# Patient Record
Sex: Female | Born: 1980 | Hispanic: Yes | Marital: Married | Smoking: Never smoker
Health system: Southern US, Community
[De-identification: ages and names within clinical notes are randomized; demographics above are authoritative.]

## PROBLEM LIST (undated history)

## (undated) ENCOUNTER — Inpatient Hospital Stay (HOSPITAL_COMMUNITY): Payer: Self-pay

## (undated) DIAGNOSIS — O352XX1 Maternal care for (suspected) hereditary disease in fetus, fetus 1: Secondary | ICD-10-CM

## (undated) DIAGNOSIS — E119 Type 2 diabetes mellitus without complications: Secondary | ICD-10-CM

## (undated) DIAGNOSIS — O09522 Supervision of elderly multigravida, second trimester: Secondary | ICD-10-CM

## (undated) DIAGNOSIS — R7611 Nonspecific reaction to tuberculin skin test without active tuberculosis: Secondary | ICD-10-CM

## (undated) HISTORY — DX: Supervision of elderly multigravida, second trimester: O09.522

## (undated) HISTORY — DX: Nonspecific reaction to tuberculin skin test without active tuberculosis: R76.11

## (undated) HISTORY — PX: NO PAST SURGERIES: SHX2092

## (undated) HISTORY — PX: TOOTH EXTRACTION: SUR596

## (undated) HISTORY — DX: Maternal care for (suspected) hereditary disease in fetus, fetus 1: O35.2XX1

---

## 2010-07-08 ENCOUNTER — Ambulatory Visit: Payer: Self-pay | Admitting: Gynecology

## 2010-07-08 ENCOUNTER — Other Ambulatory Visit
Admission: RE | Admit: 2010-07-08 | Discharge: 2010-07-08 | Payer: Self-pay | Source: Home / Self Care | Admitting: Gynecology

## 2011-05-11 ENCOUNTER — Other Ambulatory Visit: Payer: Self-pay | Admitting: Infectious Diseases

## 2011-05-11 ENCOUNTER — Ambulatory Visit
Admission: RE | Admit: 2011-05-11 | Discharge: 2011-05-11 | Disposition: A | Discharge status: Home / Self Care | Payer: No Typology Code available for payment source | Source: Ambulatory Visit | Attending: Infectious Diseases | Admitting: Infectious Diseases

## 2011-05-11 DIAGNOSIS — R7611 Nonspecific reaction to tuberculin skin test without active tuberculosis: Secondary | ICD-10-CM

## 2011-05-11 LAB — OB RESULTS CONSOLE HEPATITIS B SURFACE ANTIGEN: Hepatitis B Surface Ag: NEGATIVE

## 2011-05-11 LAB — OB RESULTS CONSOLE RUBELLA ANTIBODY, IGM: Rubella: IMMUNE

## 2011-05-11 LAB — OB RESULTS CONSOLE RPR: RPR: NONREACTIVE

## 2011-05-11 LAB — OB RESULTS CONSOLE ABO/RH

## 2011-06-05 ENCOUNTER — Emergency Department (HOSPITAL_COMMUNITY)
Admission: EM | Admit: 2011-06-05 | Discharge: 2011-06-06 | Disposition: A | Discharge status: Home / Self Care | Payer: Self-pay | Attending: Emergency Medicine | Admitting: Emergency Medicine

## 2011-06-05 ENCOUNTER — Encounter: Payer: Self-pay | Admitting: *Deleted

## 2011-06-05 DIAGNOSIS — R109 Unspecified abdominal pain: Secondary | ICD-10-CM | POA: Insufficient documentation

## 2011-06-05 DIAGNOSIS — O200 Threatened abortion: Secondary | ICD-10-CM | POA: Insufficient documentation

## 2011-06-05 NOTE — ED Notes (Addendum)
C/o vaginal bleeding, onset today, "[redacted] weeks pregnant", (denies: nvd fever or other sx). Does not have an OBGYN or PCP. Has paperwork from [facility] with positive pregnancy report, has referrals. Describes bleeding as medium bright red, has not needed or used any pads yet, "cleaned herself up and came here". Denies dizziness, weakness or tiredness.

## 2011-06-06 ENCOUNTER — Emergency Department (HOSPITAL_COMMUNITY): Payer: Self-pay

## 2011-06-06 LAB — TYPE AND SCREEN: ABO/RH(D): A POS

## 2011-06-06 LAB — ABO/RH: ABO/RH(D): A POS

## 2011-06-06 LAB — WET PREP, GENITAL
Trich, Wet Prep: NONE SEEN
Yeast Wet Prep HPF POC: NONE SEEN

## 2011-06-06 NOTE — ED Provider Notes (Signed)
History     CSN: [id number] Arrival date & time: 06/05/2011  8:46 PM   First MD Initiated Contact with Patient 06/06/11 0119      Chief Complaint  Patient presents with  . Vaginal Bleeding    (Consider location/radiation/quality/duration/timing/severity/associated sxs/prior treatment) HPI This is a 30 year old Hispanic female who is about [redacted] weeks pregnant. She developed vaginal bleeding yesterday which which she describes as mild. This is accompanied by mild suprapubic cramping. She denies nausea, vomiting, diarrhea or vaginal discharge. There are no exacerbating or mitigating factors  History reviewed. No pertinent past medical history.  History reviewed. No pertinent past surgical history.  Family History  Problem Relation Age of Onset  . Hypertension Father   . Diabetes Other     History  Substance Use Topics  . Smoking status: Never Smoker   . Smokeless tobacco: Not on file  . Alcohol Use: Yes    OB History    Grav Para Term Preterm Abortions TAB SAB Ect Mult Living   1               Review of Systems  All other systems reviewed and are negative.    Allergies  Review of patient's allergies indicates no known allergies.  Home Medications   Current Outpatient Rx  Name Route Sig Dispense Refill  . PRENATAL 27-0.8 MG PO TABS Oral Take 1 tablet by mouth daily.        BP 112/53  Pulse 74  Temp(Src) 98.7 F (37.1 C) (Oral)  Resp 18  SpO2 99%  LMP 04/23/2011  Physical Exam General: Well-developed, well-nourished female in no acute distress; appearance consistent with age of record HENT: normocephalic, atraumatic Eyes: pupils equal round and reactive to light; extraocular muscles intact Neck: supple Heart: regular rate and rhythm Lungs: clear to auscultation bilaterally Abdomen: soft; nontender; nondistended; no masses or hepatosplenomegaly; bowel sounds hyperactive GU: no flank tenderness; No vaginal bleeding; vaginal discharge, thick and mucoid; no  cervical motion tenderness mild left adnexal tenderness Extremities: No deformity; full range of motion; pulses normal Neurologic: Awake, alert; motor function intact in all extremities and symmetric; no facial droop Skin: Warm and dry Psychiatric: Normal mood and affect    ED Course  Procedures (including critical care time)   MDM   Nursing notes and vitals signs, including pulse oximetry, reviewed.  Summary of this visit's results, reviewed by myself:  Labs:  Results for orders placed during the hospital encounter of 06/05/11  HCG, QUANTITATIVE, PREGNANCY      Component Value Range   hCG, Beta Chain, Quant, S 14474 (*) <5 (mIU/mL)  TYPE AND SCREEN      Component Value Range   ABO/RH(D) A POS     Antibody Screen NEG     Sample Expiration 06/09/2011    ABO/RH      Component Value Range   ABO/RH(D) A POS    WET PREP, GENITAL      Component Value Range   Yeast, Wet Prep NONE SEEN  NONE SEEN    Trich, Wet Prep NONE SEEN  NONE SEEN    Clue Cells, Wet Prep NONE SEEN  NONE SEEN    WBC, Wet Prep HPF POC TOO NUMEROUS TO COUNT (*) NONE SEEN    Dg Chest 2 View  05/11/2011  *RADIOLOGY REPORT*  Clinical Data: Smoker.  CHEST - 2 VIEW  Comparison: None.  Findings: Midline trachea.  Normal heart size and mediastinal contours. No pleural effusion or pneumothorax.  Clear lungs.  IMPRESSION: Normal chest.  Original Report Authenticated By: Consuello Bossier, M.D.   US Ob Comp Less 14 Wks  06/06/2011  *RADIOLOGY REPORT*  Clinical Data: Vaginal bleeding for 1 day.  OBSTETRIC <14 WK [place] AND TRANSVAGINAL OB US  Technique:  Both transabdominal and transvaginal ultrasound examinations were performed for complete evaluation of the gestation as well as the maternal uterus, adnexal regions, and pelvic cul-de-sac.  Transvaginal technique was performed to assess early pregnancy.  Comparison:  None.  Intrauterine gestational sac:  Visualized/normal in shape. Yolk sac: Yes Embryo: Yes Cardiac Activity: Yes  Heart Rate: 90 bpm  CRL: 1.7   mm     Estimated at 5 weeks 3 days   [place] EDC: 02/03/2012  Maternal uterus/adnexae: A moderate amount of subchorionic hemorrhage is noted.  A small rounded focus of decreased echogenicity adjacent to the gestational sac is nonspecific and likely relates to subchorionic hemorrhage. The uterus is otherwise grossly unremarkable in appearance.  The ovaries are within normal limits.  The right ovary measures 2.9 x 2.21-0.1 cm, while the left ovary measures 2.0 x 1.6 x 1.3 cm. No suspicious adnexal masses are seen.  There is no evidence for ovarian torsion.  IMPRESSION:  1.  Single live intrauterine pregnancy noted, with a crown-rump length of 1.7 mm, corresponding to an estimated gestational age of [redacted] weeks 3 days.  This matches the gestational age of [redacted] weeks 2 days by LMP, reflecting an estimated date of delivery of January 28, 2012. 2.  Moderate subchorionic hemorrhage noted.  Original Report Authenticated By: Tonia Ghent, M.D.     Hanley Seamen, MD 06/06/11 [phone]

## 2011-06-08 LAB — GC/CHLAMYDIA PROBE AMP, GENITAL
Chlamydia, DNA Probe: NEGATIVE
GC Probe Amp, Genital: NEGATIVE

## 2011-07-28 NOTE — L&D Delivery Note (Signed)
Delivery Note At 6:24 PM a viable female was delivered via Vaginal, Spontaneous Delivery (Presentation: Right Occiput Anterior).  APGAR: 8, 9; weight pending .   Placenta status: Intact, Spontaneous.  Cord: 3 vessels with the following complications: None.    Anesthesia: Local Episiotomy: None Lacerations: 2nd degree;Labial Suture Repair: 3.0 vicryl Est. Blood Loss (mL): 800.  Patient given cytotec with resolution of PPH.  Mom to postpartum.  Baby to nursery-stable.  Shellia Hartl JEHIEL 01/23/2012, 7:07 PM

## 2011-09-08 ENCOUNTER — Other Ambulatory Visit (HOSPITAL_COMMUNITY): Payer: Self-pay | Admitting: Physician Assistant

## 2011-09-08 DIAGNOSIS — Z3689 Encounter for other specified antenatal screening: Secondary | ICD-10-CM

## 2011-09-14 ENCOUNTER — Ambulatory Visit (HOSPITAL_COMMUNITY)
Admission: RE | Admit: 2011-09-14 | Discharge: 2011-09-14 | Disposition: A | Discharge status: Home / Self Care | Payer: Self-pay | Source: Ambulatory Visit | Attending: Physician Assistant | Admitting: Physician Assistant

## 2011-09-14 DIAGNOSIS — O358XX0 Maternal care for other (suspected) fetal abnormality and damage, not applicable or unspecified: Secondary | ICD-10-CM | POA: Insufficient documentation

## 2011-09-14 DIAGNOSIS — Z3689 Encounter for other specified antenatal screening: Secondary | ICD-10-CM

## 2011-09-14 DIAGNOSIS — Z363 Encounter for antenatal screening for malformations: Secondary | ICD-10-CM | POA: Insufficient documentation

## 2011-09-14 DIAGNOSIS — Z1389 Encounter for screening for other disorder: Secondary | ICD-10-CM | POA: Insufficient documentation

## 2011-11-27 ENCOUNTER — Inpatient Hospital Stay (HOSPITAL_COMMUNITY): Payer: Self-pay

## 2011-11-27 ENCOUNTER — Inpatient Hospital Stay (HOSPITAL_COMMUNITY)
Admission: AD | Admit: 2011-11-27 | Discharge: 2011-11-27 | Disposition: A | Discharge status: Home / Self Care | Payer: Self-pay | Source: Ambulatory Visit | Attending: Obstetrics & Gynecology | Admitting: Obstetrics & Gynecology

## 2011-11-27 ENCOUNTER — Encounter (HOSPITAL_COMMUNITY): Payer: Self-pay | Admitting: *Deleted

## 2011-11-27 DIAGNOSIS — O479 False labor, unspecified: Secondary | ICD-10-CM

## 2011-11-27 DIAGNOSIS — N939 Abnormal uterine and vaginal bleeding, unspecified: Secondary | ICD-10-CM

## 2011-11-27 DIAGNOSIS — O2390 Unspecified genitourinary tract infection in pregnancy, unspecified trimester: Secondary | ICD-10-CM | POA: Insufficient documentation

## 2011-11-27 DIAGNOSIS — B373 Candidiasis of vulva and vagina: Secondary | ICD-10-CM

## 2011-11-27 DIAGNOSIS — O4690 Antepartum hemorrhage, unspecified, unspecified trimester: Secondary | ICD-10-CM | POA: Insufficient documentation

## 2011-11-27 DIAGNOSIS — B3731 Acute candidiasis of vulva and vagina: Secondary | ICD-10-CM | POA: Insufficient documentation

## 2011-11-27 LAB — WET PREP, GENITAL
Clue Cells Wet Prep HPF POC: NONE SEEN
Trich, Wet Prep: NONE SEEN

## 2011-11-27 MED ORDER — FLUCONAZOLE 150 MG PO TABS: 150.0000 mg | ORAL_TABLET | Freq: Once | ORAL | Status: AC

## 2011-11-27 MED ORDER — FLUCONAZOLE 150 MG PO TABS
150.0000 mg | ORAL_TABLET | ORAL | Status: AC
  Administered 2011-11-27: 150 mg via ORAL
  Filled 2011-11-27: qty 1

## 2011-11-27 NOTE — MAU Provider Note (Signed)
Chief Complaint:  Vaginal Bleeding   First Provider Initiated Contact with Patient 11/27/11 1110      HPI  Robin Alvarado is  31 y.o. G1P0 at [redacted]w[redacted]d presents with vaginal spotting, described as pinkish and noticed when wiping and in the toilet after urinating.  This spotting started yesterday and continues today.  She has had some recent vaginal discharge which she discussed with [facility], and reports good fetal movement, denies cramping/contractions, LOF, vaginal bleeding, vaginal itching/burning, urinary symptoms, h/a, dizziness, n/v, or fever/chills.  .   Pregnancy Course: uncomplicated  Past Medical History: Past Medical History  Diagnosis Date  . No pertinent past medical history     Past Surgical History: Past Surgical History  Procedure Date  . No past surgeries     Family History: Family History  Problem Relation Age of Onset  . Hypertension Father   . Diabetes Other   . Anesthesia problems Neg Hx     Social History: History  Substance Use Topics  . Smoking status: Never Smoker   . Smokeless tobacco: Never Used  . Alcohol Use: No    Allergies: No Known Allergies  Meds:  Prescriptions prior to admission  Medication Sig Dispense Refill  . Prenatal Vit-Fe Fumarate-FA (MULTIVITAMIN-PRENATAL) 27-0.8 MG TABS Take 1 tablet by mouth daily.            Physical Exam  Blood pressure 128/75, pulse 91, temperature 97.2 F (36.2 C), temperature source Oral, resp. rate 20, height 5\' 3"  (1.6 m), weight 78.563 kg (173 lb 3.2 oz), last menstrual period 04/23/2011. GENERAL: Well-developed, well-nourished female in no acute distress.  HEENT: normocephalic, good dentition HEART: normal rate RESP: normal effort ABDOMEN: Soft, nontender, nondistended, gravid.  EXTREMITIES: Nontender, no edema NEURO: alert and oriented  SPECULUM EXAM: Cervix with red patches, visually closed, copious amount of yellow fluid in vagina with  thick white/yellow discharge coating vaginal walls, which are erythemetous.  External genitalia normal.  Cervix is closed at internal os, but 1.5 cm externally, soft, and slightly effaced.  Ferning negative    Dilation: Closed Effacement (%): 30 Station: -3 Exam by:: L. Leftwhich-Kirby  FHT:  Baseline 140 , moderate variability, accelerations present, no decelerations Contractions: Occasional on toco, lasting 50-60 sec   Labs: Results for orders placed during the hospital encounter of 11/27/11 (from the past 24 hour(s))  WET PREP, GENITAL     Status: Abnormal   Collection Time   11/27/11 11:30 AM      Component Value Range   Yeast Wet Prep HPF POC MODERATE (*) NONE SEEN    Trich, Wet Prep NONE SEEN  NONE SEEN    Clue Cells Wet Prep HPF POC NONE SEEN  NONE SEEN    WBC, Wet Prep HPF POC MANY (*) NONE SEEN    Imaging:    Assessment: Vaginal candida Normal cervical length and AFI  Plan: Discussed pt with Dr Macon Large U/S for AFI and cervical length Diflucan 150 mg PO x1 dose in MAU Prescription for Diflucan 150 mg x1 dose for 3 days from now D/C home with PTL and bleeding precautions F/U with prenatal provider Return to MAU as needed  LEFTWICH-KIRBY, Akaila Rambo 5/3/201311:26 AM

## 2011-11-27 NOTE — MAU Provider Note (Signed)
Attestation of Attending Supervision of Advanced Practitioner: Evaluation and management procedures were performed by the OB Fellow/PA/CNM/NP under my supervision and collaboration. Chart reviewed, and agree with management and plan.  Abrahm Mancia, M.D. 11/27/2011 7:40 PM   

## 2011-11-27 NOTE — Discharge Instructions (Signed)
Infeccin por cndida en adultos (Candida Infection, Adult) Una infeccin por Cndida (tambin llamado infeccin por hongos levaduriformes, o infeccin por Monilia) es un crecimiento excesivo de hongos que puede ocurrir en cualquier parte del cuerpo. Una infeccin por cndida comnmente ocurre en las zonas ms calientes y hmedas del cuerpo. Usualmente, la infeccin permanece localizada pero puede diseminarse y convertirse en una infeccin sistmica. Una infeccin por cndida puede ser signo de un trastorno ms grave como diabetes, leucemia, o SIDA. Una infeccin por cndida puede ocurrir tanto en hombres como en mujeres. En mujeres, la vaginitis Cndida es una infeccin vaginal. Se trata de una de las causas ms frecuentes de la vaginitis. Los hombres no suelen tener sntomas hasta que se le aparecen otros problemas. Pueden descubrir que tienen una infeccin por hongos porque su compaera sexual los tiene. Es ms probable que los hombres no circuncisos adquieran una infeccin por hongo que aquellos que estn circuncindados. Esto se debe a que el glande no circuncidado no est expuesto al aire y no se mantiene tan seco como un glande circuncidado. Los adultos mayores pueden desarrollar infecciones por cndida en la zona de la dentadura. CAUSAS Mujeres  Antibiticos.   Medicamento con esteroides durante mucho tiempo.   Tener sobrepeso (obesidad).   Diabetes.   Sistema inmune deficiente.   Ciertas enfermedades.   Medicamentos inmunosupresores para pacientes con trasplante de rganos.   Quimioterapia.   El embarazo.   Menstruacin.   Estrs o fatiga.   Consumo de drogas de forma intravenosa.   Anticonceptivos orales.   Utilizar ropa ajustada en la zona de la entrepierna.   Contagio a partir de un compaero sexual que ya tiene la infeccin.   Los espermicidas.   Catteres intravenosos, urinarios, u de otro tipo.  Hombres  Contagio a partir de una compaera sexual que ya tiene la  infeccin.   Tener sexo oral o anal con una persona que tiene la infeccin.   Los espermicidas.   Diabetes.   Antibiticos.   Sistema inmune deficiente.   Medicamentos que suprimen el sistema inmune.   El uso de drogas por va intravenosa.   Intravenosa, urinarias o catteres otros.  SNTOMAS Mujeres  Flujo vaginal espeso y blanco.   Picazn vaginal.   Enrojecimiento e hinchazn en la zona de la vagina.   Irritacin de los labios de la vagina y perineo.   lceras en labios vaginales y perineo.   Relaciones sexuales dolorosas.   Bajo nivel de azcar en la sangre (hipoglucemia).   Dolor al orinar.   Infecciones en la vejiga.   Problemas intestinales como constipacin, indigestin, mal aliento, hinchazn, gases, diarrea o heces blandas.  Hombres  Primero los hombres desarrollan problemas intestinales como constipacin, indigestin, mal aliento, hinchazn, gases, diarrea o heces blandas.   Piel del pene seca y quebradiza con picazn o molestias.   Prurito de la ingle.   Piel seca y escamosa.   Pie de atleta.   Hipoglucemia.  DIAGNSTICO Mujeres  Se revisa el historial y se realiza un anlisis.   La supuracin se observa con un microscopio.   Deber tomarse un cultivo de la secrecin.  Hombres  Se revisa el historial y se realiza un anlisis.   Se observar cualquier secrecin proveniente del pene y la piel quebrada en el microscopio y se realizar un cultivo.   Podrn realizarle cultivos de heces.  TRATAMIENTO Mujeres  Supositorios y cremas antifngicos vaginales.   Cremas medicadas para disminuir la picazn e irritacin de la zona   externa de la vagina.   Aplique una bolsa caliente en la zona del perineo para disminuir molestias o inflamacin.   Medicamentos antifngicos orales.   Supositorios vaginales o cremas medicinales, para infecciones repetidas o recurrentes.   Lave y seque la zona por completo antes de aplicar la crema.   La  ingesta de yogur con lactobacillus le ayudar con la prevencin y el tratamiento.   Si las cremas y supositorios no funcionan, la aplicacin de solucin violeta de genciana puede ayudar.  Hombres  Cremas anti hongos y medicamentos orales anti hongos.   A veces el tratamiento deber continuar por 30 das despus de que los sntomas desaparecen para prevenir la recurrencia.  INSTRUCCIONES PARA EL CUIDADO DOMICILIARIO Mujeres  Utilice ropa interior de algodn y evite las ropas ajustadas.   Evite el papel higinico de color o perfumado y los tampones o toallitas con desodorante.   No utilice duchas vaginales.   Mantenga la diabetes bajo control.   Tome todos los medicamentos tal como se le indic.   Mantenga su piel limpia y seca.   Consuma leche o yogur con lactobacillus de manera regular. Si contrae infecciones por levaduras frecuentes y cree saber cul es la infeccin, existen medicamentos de venta libre. Si la infeccin no parece curarse en 3 das, hable con el mdico.   Comunique a su compaero sexual que padece una infeccin por hongos. El tambin necesitar tratamiento, en especial si la infeccin no desaparece o es recurrente.  Hombres  Mantenga su piel limpia y seca.   Mantenga la diabetes bajo control.   Tome todos los medicamentos tal como se le indic.   Comunique a su compaera sexual que sufre una infeccin por hongos.  SOLICITE ANTENCIN MDICA SI:  Los sntomas no desaparecen o empeoran luego de 1 semana de tratamiento.   Usted tiene una temperatura oral de ms de 102 F (38.9 C).   Presenta dificultades para tragar o para comer durante un tiempo prolongado.   Aparecen ampollas en los genitales.   Si aparece una hemorragia vaginal y no es el momento del perodo.   Siente dolor abdominal.   Presentara problemas intestinales como los ya descritos.   Si se siente dbil o desfalleciente.   Siente dolor al orinar u observa una mayor cantidad de orina.    Tiene dolor durante las relaciones sexuales.  ASEGRESE DE QUE:  Comprende esas instrucciones para el alta mdica.   Controlar su enfermedad.   Pedir ayuda de inmediato si no mejora o empeora.  Document Released: 07/13/2005 Document Revised: 07/02/2011 Exit[employer] Patient Information 2012 [employer]. 

## 2011-11-27 NOTE — MAU Note (Signed)
Pt states she started spotting yesterday and is very light and pinkish in color.  Denies any ROB.  Pt states baby is very active.

## 2011-11-27 NOTE — Progress Notes (Signed)
[facility] Department of Clinical Social Work Documentation of Interpretation   I assisted __Lori  RN_________________ with interpretation of ___questions___________________ for this patient. 

## 2011-12-01 LAB — GC/CHLAMYDIA PROBE AMP, GENITAL: GC Probe Amp, Genital: NEGATIVE

## 2012-01-15 ENCOUNTER — Encounter (HOSPITAL_COMMUNITY): Payer: Self-pay | Admitting: *Deleted

## 2012-01-15 ENCOUNTER — Observation Stay (HOSPITAL_COMMUNITY)
Admission: AD | Admit: 2012-01-15 | Discharge: 2012-01-15 | Disposition: A | Discharge status: Home / Self Care | Payer: MEDICAID | Source: Ambulatory Visit | Attending: Obstetrics and Gynecology | Admitting: Obstetrics and Gynecology

## 2012-01-15 ENCOUNTER — Inpatient Hospital Stay (HOSPITAL_COMMUNITY): Admission: RE | Admit: 2012-01-15 | Payer: Self-pay | Source: Ambulatory Visit

## 2012-01-15 DIAGNOSIS — O321XX0 Maternal care for breech presentation, not applicable or unspecified: Principal | ICD-10-CM

## 2012-01-15 MED ORDER — TERBUTALINE SULFATE 1 MG/ML IJ SOLN
INTRAMUSCULAR | Status: AC
Start: 2012-01-15 — End: 2012-01-15
  Administered 2012-01-15: 09:00:00
  Filled 2012-01-15: qty 1

## 2012-01-15 MED ORDER — TERBUTALINE SULFATE 1 MG/ML IJ SOLN: 0.2500 mg | Freq: Once | INTRAMUSCULAR | Status: DC

## 2012-01-15 NOTE — Discharge Instructions (Signed)
Normal Labor and Delivery Your caregiver must first be sure you are in labor. Signs of labor include:  You may pass what is called "the mucus plug" before labor begins. This is a small amount of blood stained mucus.   Regular uterine contractions.   The time between contractions get closer together.   The discomfort and pain gradually gets more intense.   Pains are mostly located in the back.   Pains get worse when walking.   The cervix (the opening of the uterus becomes thinner (begins to efface) and opens up (dilates).  Once you are in labor and admitted into the hospital or care center, your caregiver will do the following:  A complete physical examination.   Check your vital signs (blood pressure, pulse, temperature and the fetal heart rate).   Do a vaginal examination (using a sterile glove and lubricant) to determine:   The position (presentation) of the baby (head [vertex] or buttock first).   The level (station) of the baby's head in the birth canal.   The effacement and dilatation of the cervix.   You may have your pubic hair shaved and be given an enema depending on your caregiver and the circumstance.   An electronic monitor is usually placed on your abdomen. The monitor follows the length and intensity of the contractions, as well as the baby's heart rate.   Usually, your caregiver will insert an IV in your arm with a bottle of sugar water. This is done as a precaution so that medications can be given to you quickly during labor or delivery.  NORMAL LABOR AND DELIVERY IS DIVIDED UP INTO 3 STAGES: First Stage This is when regular contractions begin and the cervix begins to efface and dilate. This stage can last from 3 to 15 hours. The end of the first stage is when the cervix is 100% effaced and 10 centimeters dilated. Pain medications may be given by   Injection (morphine, demerol, etc.)   Regional anesthesia (spinal, caudal or epidural, anesthetics given in  different locations of the spine). Paracervical pain medication may be given, which is an injection of and anesthetic on each side of the cervix.  A pregnant woman may request to have "Natural Childbirth" which is not to have any medications or anesthesia during her labor and delivery. Second Stage This is when the baby comes down through the birth canal (vagina) and is born. This can take 1 to 4 hours. As the baby's head comes down through the birth canal, you may feel like you are going to have a bowel movement. You will get the urge to bear down and push until the baby is delivered. As the baby's head is being delivered, the caregiver will decide if an episiotomy (a cut in the perineum and vagina area) is needed to prevent tearing of the tissue in this area. The episiotomy is sewn up after the delivery of the baby and placenta. Sometimes a mask with nitrous oxide is given for the mother to breath during the delivery of the baby to help if there is too much pain. The end of Stage 2 is when the baby is fully delivered. Then when the umbilical cord stops pulsating it is clamped and cut. Third Stage The third stage begins after the baby is completely delivered and ends after the placenta (afterbirth) is delivered. This usually takes 5 to 30 minutes. After the placenta is delivered, a medication is given either by intravenous or injection to help contract   the uterus and prevent bleeding. The third stage is not painful and pain medication is usually not necessary. If an episiotomy was done, it is repaired at this time. After the delivery, the mother is watched and monitored closely for 1 to 2 hours to make sure there is no postpartum bleeding (hemorrhage). If there is a lot of bleeding, medication is given to contract the uterus and stop the bleeding. Document Released: 04/21/2008 Document Revised: 07/02/2011 Document Reviewed: 04/21/2008 Exit[employer] Patient Information 2012 [employer]. 

## 2012-01-15 NOTE — Progress Notes (Signed)
Patient ID: Robin Alvarado, female   DOB: [birth year], 31 y.o.   MRN: [id number] After informed verbal consent, Terbutaline 0.25 mg SQ given, ECV was attempted under Ultrasound guidance.  Forward roll completed in stages.  FHR ok by U/S.   FHR was reactive before and after the procedure.   Pt. Tolerated the procedure well.

## 2012-01-15 NOTE — H&P (Signed)
  Robin Alvarado is an 31 y.o. G1P0000 [redacted]w[redacted]d female.   Chief Complaint: Breech presentation   HPI: Pt. Is a 32 y.o.G1P0000 at [redacted]w[redacted]d that is breech.  She desires ECV.  Past Medical History  Diagnosis Date  . No pertinent past medical history     Past Surgical History  Procedure Date  . No past surgeries     Family History  Problem Relation Age of Onset  . Hypertension Father   . Diabetes Other   . Anesthesia problems Neg Hx    Social History:  reports that she has never smoked. She has never used smokeless tobacco. She reports that she does not drink alcohol or use illicit drugs.  Allergies: No Known Allergies  Medications Prior to Admission  Medication Sig Dispense Refill  . Prenatal Vit-Fe Fumarate-FA (MULTIVITAMIN-PRENATAL) 27-0.8 MG TABS Take 1 tablet by mouth daily.           A comprehensive review of systems was negative.  Height 5\' 3"  (1.6 m), weight 180 lb (81.647 kg), last menstrual period 04/23/2011. Ht 5\' 3"  (1.6 m)  Wt 180 lb (81.647 kg)  BMI 31.89 kg/m2  LMP 04/23/2011 General appearance: alert, cooperative and appears stated age Neck: no adenopathy, supple, symmetrical, trachea midline and thyroid not enlarged, symmetric, no tenderness/mass/nodules Lungs: clear to auscultation bilaterally Heart: regular rate and rhythm, S1, S2 normal, no murmur, click, rub or gallop Abdomen: soft, non-tender; bowel sounds normal; no masses,  no organomegaly Pelvic: cervix normal in appearance, external genitalia normal, uterus normal size, shape, and consistency and vagina normal without discharge Extremities: extremities normal, atraumatic, no cyanosis or edema Pulses: 2+ and symmetric Skin: Skin color, texture, turgor normal. No rashes or lesions Neurologic: Alert and oriented X 3, normal strength and tone. Normal symmetric reflexes. Normal coordination and gait   No results found for this basename: WBC, HGB, HCT, MCV, PLT   No results found for this  basename: PREGTESTUR, PREGSERUM, HCG, HCGQUANT     Assessment/Plan Patient Active Problem List  Diagnosis  . Breech presentation, antepartum   For ECV Risks include but are not limited to injury to need for emergent C-section and failure and need for scheduled C-section.  Pain increased.  Likelihood of success is 50%   Robin Alvarado S 01/15/2012, 8:03 AM

## 2012-01-22 ENCOUNTER — Inpatient Hospital Stay (HOSPITAL_COMMUNITY)
Admission: AD | Admit: 2012-01-22 | Discharge: 2012-01-25 | DRG: 774 | Disposition: A | Discharge status: Home / Self Care | Payer: Medicaid Other | Source: Ambulatory Visit | Attending: Obstetrics and Gynecology | Admitting: Obstetrics and Gynecology

## 2012-01-22 ENCOUNTER — Encounter (HOSPITAL_COMMUNITY): Payer: Self-pay | Admitting: *Deleted

## 2012-01-22 DIAGNOSIS — O99892 Other specified diseases and conditions complicating childbirth: Secondary | ICD-10-CM | POA: Diagnosis present

## 2012-01-22 DIAGNOSIS — Z22330 Carrier of Group B streptococcus: Secondary | ICD-10-CM

## 2012-01-22 DIAGNOSIS — IMO0001 Reserved for inherently not codable concepts without codable children: Secondary | ICD-10-CM

## 2012-01-22 NOTE — MAU Note (Signed)
Pt states, per interpreter, " I have had contractions since this morning, and I had a hard contraction at 2035 and then my water broke. Everytime I have a contraction, more water runs out and the contractions have gotten closer, about every five min."

## 2012-01-23 ENCOUNTER — Encounter (HOSPITAL_COMMUNITY): Payer: Self-pay | Admitting: Family

## 2012-01-23 DIAGNOSIS — O9989 Other specified diseases and conditions complicating pregnancy, childbirth and the puerperium: Secondary | ICD-10-CM

## 2012-01-23 LAB — CBC
HCT: 34.4 % — ABNORMAL LOW (ref 36.0–46.0)
MCV: 79.6 fL (ref 78.0–100.0)
RDW: 15.6 % — ABNORMAL HIGH (ref 11.5–15.5)
WBC: 10 10*3/uL (ref 4.0–10.5)

## 2012-01-23 LAB — TYPE AND SCREEN
ABO/RH(D): A POS
Antibody Screen: NEGATIVE

## 2012-01-23 MED ORDER — LACTATED RINGERS IV SOLN: 500.0000 mL | Freq: Once | INTRAVENOUS | Status: DC

## 2012-01-23 MED ORDER — ONDANSETRON HCL 4 MG PO TABS: 4.0000 mg | ORAL_TABLET | ORAL | Status: DC | PRN

## 2012-01-23 MED ORDER — OXYCODONE-ACETAMINOPHEN 5-325 MG PO TABS: 1.0000 | ORAL_TABLET | ORAL | Status: DC | PRN

## 2012-01-23 MED ORDER — FENTANYL CITRATE 0.05 MG/ML IJ SOLN
100.0000 ug | INTRAMUSCULAR | Status: DC | PRN
  Administered 2012-01-23: 100 ug via INTRAVENOUS
  Filled 2012-01-23: qty 2

## 2012-01-23 MED ORDER — LACTATED RINGERS IV SOLN
INTRAVENOUS | Status: DC
  Administered 2012-01-23 (×3): via INTRAVENOUS

## 2012-01-23 MED ORDER — LACTATED RINGERS IV SOLN: 500.0000 mL | INTRAVENOUS | Status: DC | PRN

## 2012-01-23 MED ORDER — PENICILLIN G POTASSIUM 5000000 UNITS IJ SOLR
5.0000 10*6.[IU] | Freq: Once | INTRAVENOUS | Status: AC
  Administered 2012-01-23: 5 10*6.[IU] via INTRAVENOUS
  Filled 2012-01-23: qty 5

## 2012-01-23 MED ORDER — EPHEDRINE 5 MG/ML INJ: 10.0000 mg | INTRAVENOUS | Status: DC | PRN

## 2012-01-23 MED ORDER — OXYTOCIN 40 UNITS IN LACTATED RINGERS INFUSION - SIMPLE MED: 62.5000 mL/h | Freq: Once | INTRAVENOUS | Status: DC

## 2012-01-23 MED ORDER — DIBUCAINE 1 % RE OINT: 1.0000 "application " | TOPICAL_OINTMENT | RECTAL | Status: DC | PRN

## 2012-01-23 MED ORDER — OXYTOCIN 40 UNITS IN LACTATED RINGERS INFUSION - SIMPLE MED
1.0000 m[IU]/min | INTRAVENOUS | Status: DC
  Administered 2012-01-23: 2 m[IU]/min via INTRAVENOUS
  Filled 2012-01-23: qty 1000

## 2012-01-23 MED ORDER — CITRIC ACID-SODIUM CITRATE 334-500 MG/5ML PO SOLN: 30.0000 mL | ORAL | Status: DC | PRN

## 2012-01-23 MED ORDER — NALBUPHINE SYRINGE 5 MG/0.5 ML
10.0000 mg | INJECTION | INTRAMUSCULAR | Status: DC | PRN
  Administered 2012-01-23 (×2): 10 mg via INTRAVENOUS
  Filled 2012-01-23 (×2): qty 1

## 2012-01-23 MED ORDER — DIPHENHYDRAMINE HCL 50 MG/ML IJ SOLN: 12.5000 mg | INTRAMUSCULAR | Status: DC | PRN

## 2012-01-23 MED ORDER — IBUPROFEN 600 MG PO TABS: 600.0000 mg | ORAL_TABLET | Freq: Four times a day (QID) | ORAL | Status: DC | PRN

## 2012-01-23 MED ORDER — PENICILLIN G POTASSIUM 5000000 UNITS IJ SOLR: 2.5000 10*6.[IU] | INTRAMUSCULAR | Status: DC

## 2012-01-23 MED ORDER — DIPHENHYDRAMINE HCL 25 MG PO CAPS: 25.0000 mg | ORAL_CAPSULE | Freq: Four times a day (QID) | ORAL | Status: DC | PRN

## 2012-01-23 MED ORDER — TETANUS-DIPHTH-ACELL PERTUSSIS 5-2.5-18.5 LF-MCG/0.5 IM SUSP
0.5000 mL | Freq: Once | INTRAMUSCULAR | Status: AC
  Administered 2012-01-24: 0.5 mL via INTRAMUSCULAR
  Filled 2012-01-23: qty 0.5

## 2012-01-23 MED ORDER — TERBUTALINE SULFATE 1 MG/ML IJ SOLN: 0.2500 mg | Freq: Once | INTRAMUSCULAR | Status: DC | PRN

## 2012-01-23 MED ORDER — FLEET ENEMA 7-19 GM/118ML RE ENEM: 1.0000 | ENEMA | RECTAL | Status: DC | PRN

## 2012-01-23 MED ORDER — SODIUM CHLORIDE 0.9 % IJ SOLN
3.0000 mL | Freq: Two times a day (BID) | INTRAMUSCULAR | Status: DC
  Administered 2012-01-23: 3 mL via INTRAVENOUS

## 2012-01-23 MED ORDER — ONDANSETRON HCL 4 MG/2ML IJ SOLN: 4.0000 mg | INTRAMUSCULAR | Status: DC | PRN

## 2012-01-23 MED ORDER — BENZOCAINE-MENTHOL 20-0.5 % EX AERO
1.0000 "application " | INHALATION_SPRAY | CUTANEOUS | Status: DC | PRN
  Administered 2012-01-24: 1 via TOPICAL
  Filled 2012-01-23: qty 56

## 2012-01-23 MED ORDER — ONDANSETRON HCL 4 MG/2ML IJ SOLN: 4.0000 mg | Freq: Four times a day (QID) | INTRAMUSCULAR | Status: DC | PRN

## 2012-01-23 MED ORDER — ACETAMINOPHEN 325 MG PO TABS: 650.0000 mg | ORAL_TABLET | ORAL | Status: DC | PRN

## 2012-01-23 MED ORDER — WITCH HAZEL-GLYCERIN EX PADS: 1.0000 "application " | MEDICATED_PAD | CUTANEOUS | Status: DC | PRN

## 2012-01-23 MED ORDER — LIDOCAINE HCL (PF) 1 % IJ SOLN
30.0000 mL | INTRAMUSCULAR | Status: DC | PRN
  Administered 2012-01-23: 30 mL via SUBCUTANEOUS
  Filled 2012-01-23: qty 30

## 2012-01-23 MED ORDER — ZOLPIDEM TARTRATE 5 MG PO TABS: 5.0000 mg | ORAL_TABLET | Freq: Every evening | ORAL | Status: DC | PRN

## 2012-01-23 MED ORDER — SENNOSIDES-DOCUSATE SODIUM 8.6-50 MG PO TABS
2.0000 | ORAL_TABLET | Freq: Every day | ORAL | Status: DC
  Administered 2012-01-23 – 2012-01-24 (×2): 2 via ORAL

## 2012-01-23 MED ORDER — SIMETHICONE 80 MG PO CHEW: 80.0000 mg | CHEWABLE_TABLET | ORAL | Status: DC | PRN

## 2012-01-23 MED ORDER — OXYTOCIN BOLUS FROM INFUSION
250.0000 mL | Freq: Once | INTRAVENOUS | Status: DC
  Filled 2012-01-23: qty 500

## 2012-01-23 MED ORDER — MISOPROSTOL 200 MCG PO TABS
ORAL_TABLET | ORAL | Status: AC
  Administered 2012-01-23: 1000 ug via RECTAL
  Filled 2012-01-23: qty 5

## 2012-01-23 MED ORDER — IBUPROFEN 600 MG PO TABS
600.0000 mg | ORAL_TABLET | Freq: Four times a day (QID) | ORAL | Status: DC
  Administered 2012-01-24 – 2012-01-25 (×7): 600 mg via ORAL
  Filled 2012-01-23 (×7): qty 1

## 2012-01-23 MED ORDER — PHENYLEPHRINE 40 MCG/ML (10ML) SYRINGE FOR IV PUSH (FOR BLOOD PRESSURE SUPPORT): 80.0000 ug | PREFILLED_SYRINGE | INTRAVENOUS | Status: DC | PRN

## 2012-01-23 MED ORDER — PENICILLIN G POTASSIUM 5000000 UNITS IJ SOLR
2.5000 10*6.[IU] | INTRAVENOUS | Status: DC
  Administered 2012-01-23 (×3): 2.5 10*6.[IU] via INTRAVENOUS
  Filled 2012-01-23 (×6): qty 2.5

## 2012-01-23 MED ORDER — FENTANYL 2.5 MCG/ML BUPIVACAINE 1/10 % EPIDURAL INFUSION (WH - ANES): 14.0000 mL/h | INTRAMUSCULAR | Status: DC

## 2012-01-23 MED ORDER — LANOLIN HYDROUS EX OINT: TOPICAL_OINTMENT | CUTANEOUS | Status: DC | PRN

## 2012-01-23 MED ORDER — PRENATAL MULTIVITAMIN CH
1.0000 | ORAL_TABLET | Freq: Every day | ORAL | Status: DC
  Administered 2012-01-24 – 2012-01-25 (×2): 1 via ORAL
  Filled 2012-01-23 (×2): qty 1

## 2012-01-23 NOTE — H&P (Signed)
Robin Alvarado is a 31 y.o. female presenting for SROM and labor evaluation.  She had a successful external version by Dr Shawnie Pons on 01/15/12.  She reports good fetal movement, denies vaginal bleeding, vaginal itching/burning, urinary symptoms, h/a, dizziness, n/v, or fever/chills.    Maternal Medical History:  Reason for admission: Reason for admission: rupture of membranes and contractions.  Reason for Admission:   nauseaContractions: Onset was 3-5 hours ago.   Frequency: regular.   Duration is approximately 1 minute.   Perceived severity is strong.    Fetal activity: Perceived fetal activity is normal.   Last perceived fetal movement was within the past hour.    Prenatal Complications - Diabetes: none.    OB History    Grav Para Term Preterm Abortions TAB SAB Ect Mult Living   1 0 0 0 0 0 0 0 0 0      Past Medical History  Diagnosis Date  . No pertinent past medical history    Past Surgical History  Procedure Date  . No past surgeries    Family History: family history includes Diabetes in her other and Hypertension in her father.  There is no history of Anesthesia problems and Other. Social History:  reports that she has never smoked. She has never used smokeless tobacco. She reports that she does not drink alcohol or use illicit drugs.   Prenatal Transfer Tool  Maternal Diabetes: No Genetic Screening: Declined Maternal Ultrasounds/Referrals: Normal Fetal Ultrasounds or other Referrals:  None Maternal Substance Abuse:  No Significant Maternal Medications:  None Significant Maternal Lab Results:  None Other Comments:  GBS positive  Review of Systems  Constitutional: Negative for fever, chills and malaise/fatigue.  Eyes: Negative for blurred vision.  Respiratory: Negative for cough and shortness of breath.   Cardiovascular: Negative for chest pain.  Gastrointestinal: Positive for abdominal pain. Negative for heartburn, nausea and vomiting.  Genitourinary:  Negative for dysuria, urgency and frequency.  Musculoskeletal: Negative.   Neurological: Negative for dizziness and headaches.  Psychiatric/Behavioral: Negative for depression.    Dilation: 3 Effacement (%): 90 Station: -2 Exam by:: Arlys John RN  Blood pressure 133/85, pulse 81, temperature 98 F (36.7 C), temperature source Oral, resp. rate 20, height 5\' 3"  (1.6 m), weight 83.519 kg (184 lb 2 oz), last menstrual period 04/23/2011. Maternal Exam:  Uterine Assessment: Contraction strength is moderate.  Abdomen: Fetal presentation: vertex  Introitus: Ferning test: positive.  Amniotic fluid character: clear.  Cervix: Cervix evaluated by digital exam.     Fetal Exam Fetal Monitor Review: Mode: ultrasound.   Variability: moderate (6-25 bpm).   Pattern: accelerations present and no decelerations.    Fetal State Assessment: Category I - tracings are normal.     Physical Exam  Nursing note and vitals reviewed. Constitutional: She is oriented to person, place, and time. She appears well-developed and well-nourished.  Neck: Normal range of motion.  Cardiovascular: Normal rate, regular rhythm and normal heart sounds.   Respiratory: Effort normal.  GI: Soft.  Musculoskeletal: Normal range of motion.  Neurological: She is alert and oriented to person, place, and time. She has normal reflexes.  Skin: Skin is warm and dry.  Psychiatric: She has a normal mood and affect. Her behavior is normal. Judgment and thought content normal.    Prenatal labs: ABO, Rh: --/--/A POS, A POS (11/10 0135) Antibody: NEG (11/10 0135) Rubella: Immune (10/15 0000) RPR: Nonreactive (10/15 0000)  HBsAg: Negative (10/15 0000)  HIV:    GBS: Positive (06/13  0000)   Assessment/Plan: SROM Active labor  Admit to birthing suites PCN for GBS prophylaxis Anticipate NSVD   LEFTWICH-KIRBY, Robin Alvarado 01/23/2012, 1:27 AM

## 2012-01-23 NOTE — Progress Notes (Signed)
  Subjective: Patient more comfortable with nubain.  No complaints.  Objective: BP 132/85  Pulse 72  Temp 97.9 F (36.6 C) (Oral)  Resp 20  Ht 5\' 3"  (1.6 m)  Wt 83.462 kg (184 lb)  BMI 32.59 kg/m2  LMP 04/23/2011      FHT:  FHR: 130s bpm, variability: moderate,  accelerations:  Present,  decelerations:  Absent UC:   regular, every 2-3 minutes SVE:   Dilation: 6.5 Effacement (%): 100 Station: 0 Exam by:: Enis Slipper, RN  Labs: Lab Results  Component Value Date   WBC 10.0 01/23/2012   HGB 10.9* 01/23/2012   HCT 34.4* 01/23/2012   MCV 79.6 01/23/2012   PLT 215 01/23/2012    Assessment / Plan: Category 1 tracing.  Continue expectant management  Alisse Tuite JEHIEL 01/23/2012, 11:14 AM

## 2012-01-23 NOTE — H&P (Signed)
Robin Alvarado is a 31 y.o. female presenting for Rupture of Membranes. Maternal Medical History:  Reason for admission: Reason for admission: contractions.  Contractions: Onset was 3-5 hours ago.   Frequency: regular.    Fetal activity: Perceived fetal activity is normal.   Last perceived fetal movement was within the past hour.    Prenatal complications: no prenatal complications   OB History    Grav Para Term Preterm Abortions TAB SAB Ect Mult Living   1 0 0 0 0 0 0 0 0 0      Past Medical History  Diagnosis Date  . No pertinent past medical history    Past Surgical History  Procedure Date  . No past surgeries    Family History: family history includes Diabetes in her other and Hypertension in her father.  There is no history of Anesthesia problems and Other. Social History:  reports that she has never smoked. She has never used smokeless tobacco. She reports that she does not drink alcohol or use illicit drugs.   Prenatal Transfer Tool  Maternal Diabetes: No Genetic Screening: Declined Maternal Ultrasounds/Referrals: Normal Fetal Ultrasounds or other Referrals:  None Maternal Substance Abuse:  No Significant Maternal Medications:  None Significant Maternal Lab Results:  Lab values include: Group B Strep positive Other Comments:  Prenatal records not available  Review of Systems  Gastrointestinal: Positive for abdominal pain (contractions).  Genitourinary:       Rupture of membranes  All other systems reviewed and are negative.    Dilation: 3 Effacement (%): 90 Station: -2 Exam by:: Arlys John RN  Blood pressure 133/85, pulse 81, temperature 98 F (36.7 C), temperature source Oral, resp. rate 20, height 5\' 3"  (1.6 m), weight 83.519 kg (184 lb 2 oz), last menstrual period 04/23/2011. Maternal Exam:  Uterine Assessment: Contraction strength is moderate.  Contraction frequency is irregular.   Abdomen: Estimated fetal weight is 7.5-8.   Fetal  presentation: vertex Vertex confirmed by bedside ultrasound with Dr. Emelda Fear present.     Physical Exam  Constitutional: She is oriented to person, place, and time. She appears well-developed and well-nourished. No distress.  HENT:  Head: Normocephalic.  Neck: Normal range of motion. Neck supple.  Cardiovascular: Normal rate, regular rhythm and normal heart sounds.   Respiratory: Effort normal and breath sounds normal.  GI: Soft. There is no tenderness.  Genitourinary: No bleeding around the vagina.  Musculoskeletal: Normal range of motion. She exhibits no edema.  Neurological: She is alert and oriented to person, place, and time.  Skin: Skin is warm and dry.   Cervix 3/90/-2 Prenatal labs: ABO, Rh: --/--/A POS, A POS (11/10 0135) Antibody: NEG (11/10 0135) Rubella: Immune (10/15 0000) RPR: Nonreactive (10/15 0000)  HBsAg: Negative (10/15 0000)  HIV:    GBS: Positive (06/13 0000)   Assessment/Plan: SROM Labor  Plan: Admit to [employer] Anticipate NSVD   [facility] 01/23/2012, 2:03 AM

## 2012-01-23 NOTE — Progress Notes (Signed)
Transferred to 106 via wheelchair at 2010, holding infant, family members with pt

## 2012-01-23 NOTE — Progress Notes (Signed)
Robin Alvarado is a 31 y.o. G1P0000 at [redacted]w[redacted]d admitted for active labor, rupture of membranes  Subjective: Pt reports some discomfort with ctx, rating pain 4/10.  S/O in room for support.  Objective: BP 133/78  Pulse 78  Temp 97.8 F (36.6 C) (Oral)  Resp 20  Ht 5\' 3"  (1.6 m)  Wt 83.462 kg (184 lb)  BMI 32.59 kg/m2  LMP 04/23/2011      FHT:  FHR: 135 bpm, variability: moderate,  accelerations:  Present,  decelerations:  Absent UC:   regular, every 5 minutes SVE:   4/70/-3, vertex, by Sharen Counter, CNM   Labs: Lab Results  Component Value Date   WBC 10.0 01/23/2012   HGB 10.9* 01/23/2012   HCT 34.4* 01/23/2012   MCV 79.6 01/23/2012   PLT 215 01/23/2012    Assessment / Plan: Protracted latent phase with SROM   Labor: Slow progres with ROM, Plan to start Pitocin Preeclampsia:  N/A Fetal Wellbeing:  Category I Pain Control:  Labor support without medications I/D:  n/a Anticipated MOD:  NSVD  LEFTWICH-KIRBY, Codie Krogh 01/23/2012, 5:41 AM

## 2012-01-24 LAB — CBC
HCT: 26.6 % — ABNORMAL LOW (ref 36.0–46.0)
MCHC: 32.3 g/dL (ref 30.0–36.0)
Platelets: 194 10*3/uL (ref 150–400)
RDW: 15.8 % — ABNORMAL HIGH (ref 11.5–15.5)
WBC: 16.6 10*3/uL — ABNORMAL HIGH (ref 4.0–10.5)

## 2012-01-24 NOTE — Progress Notes (Signed)
Post Partum Day 1 Subjective: no complaints, up ad lib, voiding and tolerating PO; breast, desires condoms; discharge in am.  Objective: Blood pressure 106/69, pulse 88, temperature 98.5 F (36.9 C), temperature source Oral, resp. rate 18, height 5\' 3"  (1.6 m), weight 83.462 kg (184 lb), last menstrual period 04/23/2011, unknown if currently breastfeeding.  Physical Exam:  General: alert, cooperative and appears stated age Lochia: appropriate Uterine Fundus: firm Incision: n/a DVT Evaluation: No evidence of DVT seen on physical exam. Negative Homan's sign.    Basename 01/24/12 0544 01/23/12 0210  HGB 8.5* 10.9*  HCT 26.6* 34.4*    Assessment/Plan: Plan for discharge tomorrow   LOS: 2 days   [facility] 01/24/2012, 7:43 AM

## 2012-01-24 NOTE — Progress Notes (Signed)
PSYCHOSOCIAL ASSESSMENT ~ MATERNAL/CHILD Name: Robin Alvarado         Age: 31    Referral Date: 01/23/2012   Reason/Source: LPNC/CN  I. FAMILY/HOME ENVIRONMENT A. Child's Legal Guardian Parent(s)     Name:  Shavonte Zhao DOB: [birth year]    Age:  53 Address:  [address] [place] [address] Name:  Hannah Beat Alba-spouse Address:  Same  B. Other Household Members/Support Persons:  Family and friends  C.   Other Support:  II. PSYCHOSOCIAL DATA A. Information Source X Patient Interview            B. Event organiser X [employer]- Will apply for self and baby      X Food Stamps - will apply     X WIC- will apply   C. Cultural and Environment Information: Cultural Issues Impacting Care-language barrier/status affecting ability to access some services III. STRENGTHS X Supportive family/friends   X Adequate Resources  X Home prepared for Child (including basic supplies)                 X Other-Pediatrician- [facility]  IV. RISK FACTORS AND CURRENT PROBLEMS        Language barrier/parental status affecting access to services            V. SOCIAL WORK ASSESSMENT Met with MOB, baby and FOB at bedside.  Parents are excited with the birth of their first child.  Mom reports she did not access care due to not having insurance.  She reports the hospital spoke with her about working on an application for emergency Medicaid.  Mom plans to apply for Food Stamps and WIC.  Parents have a crib set up in their room to be close to baby.  They have a car seat and all needed supplies for baby.  Discussed wrap-around care available at her pediatrician's office and encouraged parents to follow up and inquire about resources to support optimal child/family wellness.  Parents did not have any questions, needs, or concerns at this time.  Mom is breastfeeding her son.    VI. SOCIAL WORK PLAN X No Further Intervention Required/No Barriers to  Discharge  Staci Acosta, MSW LCSW 01/24/2012 2:59 pm

## 2012-01-25 MED ORDER — IBUPROFEN 600 MG PO TABS: 600.0000 mg | ORAL_TABLET | Freq: Four times a day (QID) | ORAL | Status: AC

## 2012-01-25 NOTE — Discharge Instructions (Signed)
Cuidados luego de un parto por vía vaginal °(Postpartum Care After Vaginal Delivery) °Luego del nacimiento del bebé deberá permanecer en el hospital durante 24 a 72 horas, excepto que hubiera existido algún problema, o usted sufra alguna enfermedad. Mientras se encuentre en el hospital recibirá ayuda e instrucciones por parte de las enfermeras y el médico, quienes cuidarán de usted y su bebé y le darán consejos para amamantarlo correctamente, especialmente si es el primer hijo.  °En caso de ser necesario, le prescribirán analgésicos. Observará una pequeña hemorragia vaginal y deberá cambiar los apósitos con frecuencia. Lávese las manos cuidadosamente con agua y jabón durante al menos 20 segundos luego de cambiarse el apósito o ir al baño. Si elimina coágulos o aumenta la hemorragia, infórmelo a la enfermera. No deseche los coágulos sanguíneos antes de mostrárselos a la enfermera, para asegurarse de que no es tejido placentario. °Si le han colocado una vía intravenosa, se la retirarán dentro de las 24 horas, si no hay problemas. La primera vez que se levante de la cama o tome una ducha, llame a la enfermera para que la ayude que puede sentirse débil, mareada o desmayarse. Si está amamantando, puede sentir contracciones dolorosas en el útero durante algunas semanas. Esto es normal y necesario, ya que de este modo el útero vuelve a su tamaño normal. Si no está amamantando, utilice un sostén de soporte y trate de no tocarse las mamas hasta que haya dejado de producir leche. No deben administrarse hormonas para suprimir la leche, debido a que pueden causar coágulos sanguíneos. Podrá seguir una dieta normal, excepto que sufra diabetes o presente otros problemas de salud.  °La enfermera colocará bolsas con hielo en el sitio de la episiotomía (agrandamiento quirúrgico de la apertura vaginal) para reducir el dolor y la hinchazón. En algunos casos raros hay dificultad para orinar, entonces la enfermera deberá vaciarle la  vejiga con un catéter. Si le han practicado una ligadura tubaria durante el posparto ("trompas atadas", esterilización femenina), esto no hará que permanezca más tiempo en el hospital. °Podrá tener al bebé en su habitación todo el tiempo que lo desee si el bebé no tiene ningún problema. Lleve y traiga al bebé de la nursery dentro de la cunita. No lo lleve en brazos. No abandone el área de posparto. Si la madre es Rh negativa (falta de una proteína en los glóbulos rojos) y el bebé es Rh positivo, la madre debe aplicarse la vacuna RhoGam para evitar problemas con el factor Rh en futuros embarazos °Le darán instrucciones por escrito para usted y el bebé y los medicamentos necesarios cuando reciba el alta médica. Asegúrese que comprende y sigue las indicaciones. °INSTRUCCIONES PARA EL CUIDADO DOMICILIARIO °· Siga las instrucciones y tome los medicamentos que le indicaron cuando le dieron el alta médica.  °· Utilice los medicamentos de venta libre o de prescripción para el dolor, el malestar o la fiebre, según se lo indique el profesional que lo asiste.  °· No tome aspirina, ya que puede causar hemorragias.  °· Aumente sus actividades un poco cada día para tener más fuerza y resistencia.  °· No beba alcohol, especialmente si está amamantando o toma analgésicos.  °· Tómese la temperatura dos veces por día y regístrela.  °· Podrá tener una pequeña hemorragia durante 2 a 4 semanas. Esto es normal.  °· No utilice tampones o duchas vaginales, use toallas higiénicas.  °· Trate de que alguna persona permanezca con usted y la ayude durante los primeros días en el hogar.  °·   Descanse o duerma una siesta cuando el bebé duerma.  °· Si está amamantando, use un buen sostén. Si no está amamantando, use un buen sostén y no estimule los pezones.  °· Consuma una dieta sana y siga tomando las vitaminas prenatales.  °· No conduzca vehículos, no realice actividades pesadas ni viaje hasta que su médico la autorice.  °· No mantenga relaciones  sexuales hasta que el médico lo permita.  °· Consulte con el profesional cuando puede comenzar a realizar actividad física y que tipo de ejercicios puede hacer.  °· Comuníquese inmediatamente con el médico si tiene problemas luego del parto.  °· Comuníquese con el pediatra si tiene problemas con el bebé.  °· Programe su visita de control luego del parto y cúmplala.  °SOLICITE ATENCIÓN MÉDICA SI: °· La temperatura se eleva por encima de 100° F (37.8° C).  °· Aumenta la hemorragia vaginal o elimina coágulos. Conserve algunos coágulos para mostrárselos al médico.  °· Observa sangre o siente dolor al orinar.  °· Presenta secreción vaginal con olor fétido.  °· Aumenta el dolor o la inflamación en el sitio de la episiotomía (agrandamiento quirúrgico de la apertura vaginal).  °· Sufre una cefalea grave.  °· Se siente deprimida.  °· La incisión se abre.  °· Se siente mareada o sufre un desmayo.  °· Aparece una erupción cutánea.  °· Tiene una reacción o problemas con su medicamento.  °· Siente dolor u observa enrojecimiento e hinchazón en el sitio de la vía intravenosa.  °SOLICITE ATENCIÓN MÉDICA DE INMEDIATO SI: °· Siente dolor en el pecho.  °· Comienza a sentir falta de aire.  °· Se desmaya.  °· Siente dolor, con o sin hinchazón e irritación en la pierna.  °· Tiene una hemorragia vaginal abundante, con o sin coágulos  °· Siente dolor en el estómago.  °· Observa una secreción vaginal con mal olor.  °ASEGURESE QUE:  °· Comprende estas instrucciones.  °· Controlará su enfermedad.  °· Solicitará ayuda de inmediato si no mejora o empeora.  °Document Released: 05/10/2007 Document Revised: 07/02/2011 °ExitCare® Patient Information ©2012 [employer]. °

## 2012-01-25 NOTE — Progress Notes (Signed)
UR chart review completed.  

## 2012-01-25 NOTE — Discharge Summary (Signed)
Obstetric Discharge Summary Reason for Admission: onset of labor and rupture of membranes Prenatal Procedures: none Intrapartum Procedures: spontaneous vaginal delivery Postpartum Procedures: none Complications-Operative and Postpartum: hemorrhage and 2nd degree labial lac Hemoglobin  Date Value Range Status  01/24/2012 8.5* 12.0 - 15.0 g/dL Final     DELTA CHECK NOTED     REPEATED TO VERIFY     HCT  Date Value Range Status  01/24/2012 26.6* 36.0 - 46.0 % Final   Denies S/S of anemia. Physical Exam:  General: alert, cooperative and no distress Heart: RRR Lungs: nl effort Lochia: appropriate Uterine Fundus: firm DVT Evaluation: No evidence of DVT seen on physical exam.  Discharge Diagnoses: Term Pregnancy-delivered  Discharge Information: Date: 01/25/2012 Activity: pelvic rest Diet: routine Medications: PNV and Ibuprofen Condition: stable Instructions: refer to practice specific booklet Discharge to: home Follow-up Information    Follow up with [facility] [place]. Schedule an appointment as soon as possible for a visit in 4 weeks.   Contact information:   [address] [employer] [place] [address]          Newborn Data: Live born female  Birth Weight: 9 lb 3.6 oz (4185 g) APGAR: 8, 9  Home with mother. Breastfeeding well; wants to use condoms for contraception.  Robin Alvarado 01/25/2012, 6:33 AM

## 2012-01-29 NOTE — H&P (Signed)
Attestation of Attending Supervision of Advanced Practitioner: Evaluation and management procedures were performed by the PA/NP/CNM/OB Fellow under my supervision/collaboration. Chart reviewed and agree with management and plan.  Sara Keys V 01/29/2012 7:51 AM    

## 2012-01-30 NOTE — Discharge Summary (Signed)
Attestation of Attending Supervision of Advanced Practitioner: Evaluation and management procedures were performed by the PA/NP/CNM/OB Fellow under my supervision/collaboration. Chart reviewed and agree with management and plan.  Mitchell Iwanicki V 01/30/2012 6:05 AM

## 2012-01-31 NOTE — H&P (Signed)
Attestation of Attending Supervision of Advanced Practitioner: Evaluation and management procedures were performed by the PA/NP/CNM/OB Fellow under my supervision/collaboration. Chart reviewed and agree with management and plan.  Million Maharaj V 01/31/2012 11:07 AM    

## 2012-07-27 NOTE — L&D Delivery Note (Signed)
Delivery Note At 5:36 PM a viable female was delivered via Vaginal, Spontaneous Delivery.  Presentation: vertex; Position: Left,, Occiput,, Anterior  Delivery of the head: 06/05/2013  5:36 PM First maneuver: 06/05/2013  5:36 AM, Posterior arm delivered   Verbal consent: obtained from patient.  APGAR: 8, 9; weight 9 lb 1.9 oz (4136 g).   Placenta status: Intact, Spontaneous.   Cord: 3 vessels with the following complications: None.   Anesthesia: Local  Episiotomy: None Lacerations: 3rd degree;Perineal Suture Repair: 3.0 vicryl Est. Blood Loss (mL): 450  Mother pushed for 15 mins to deliver viable female infant in LOA position. Delivery of body complicated by shoulder dystocia which was relieved with McRoberts and delivery of posterior arm. Total dystocia time was 30 seconds. Baby vigorous, crying and moving both arms upon delivery. Cord clamping delayed until umbilical cord stopped pusling. Apgars 8 and 9. Weight 4136 grams. Third stage of labor actively managed with gentle cord traction and Oxytocin. Placenta delivered intact with 3 vessel cord. Attention was then turned to the perineum which sustained a partial 3rd degree laceration. Interrupted sutures were used to reapproximate the partially torn tissue. The remaining 2nd degree laceration was repaired in the usual fashion using 3.0 Vicryl. Pt tolerated the procedure well and was stable at conclusion of the delivery. Counts correct and verified. Mom to postpartum with stool softeners. Infant to couplet care/skin to skin.    Robin Alvarado, Robin Alvarado 06/05/2013, 7:18 PM  I was present for and assisted with the delivery of this newborn.  I agree with documentation as above.  Dr. Marice Potter present for initial portion of repair.  Minimal damage to the anal sphincter overall.   Caitlynn Ju, Redmond Baseman, MD

## 2012-11-21 IMAGING — CR DG CHEST 2V
2 series · 2 of 2 positions shown · non-contrast
Comparison: None.

CLINICAL DATA: Smoker.

CHEST - 2 VIEW

[w chest pa]
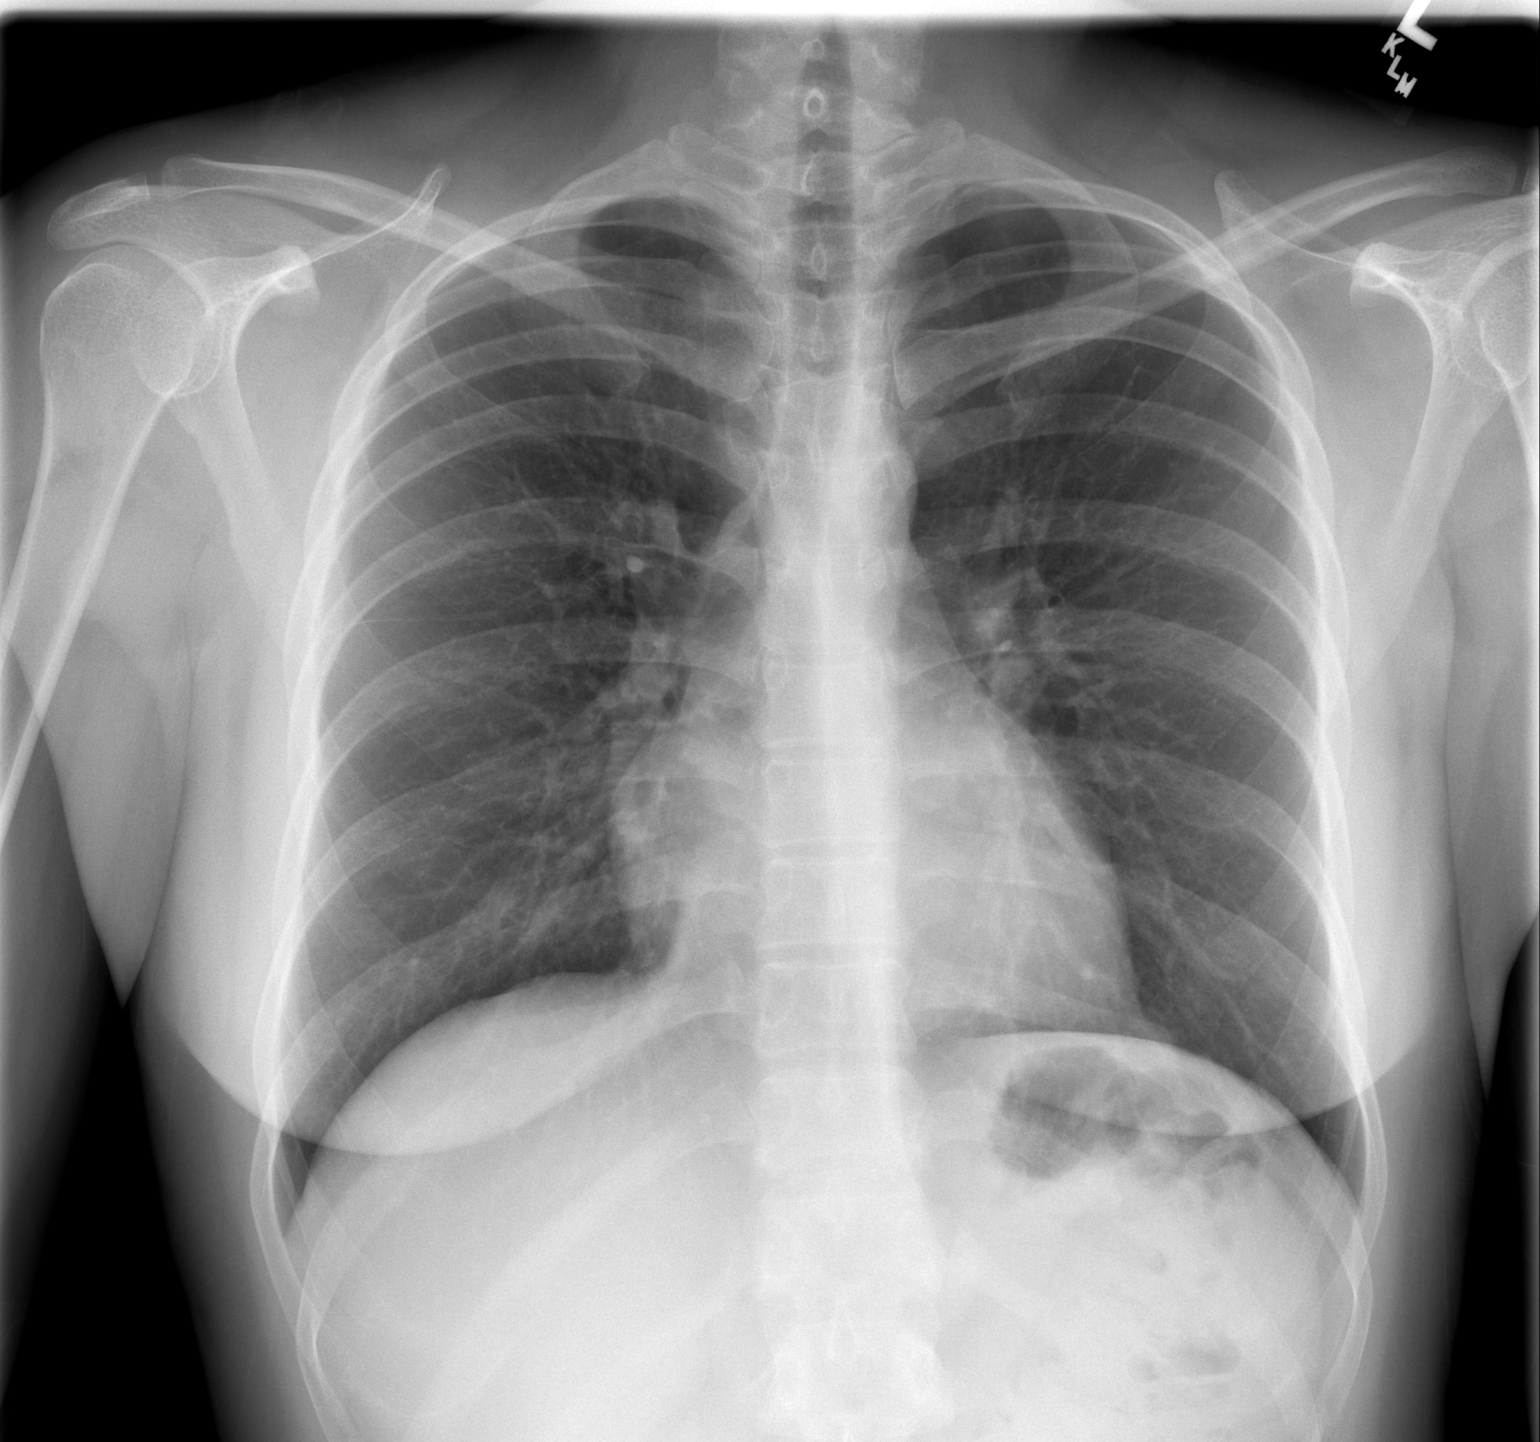

[w chest lat]
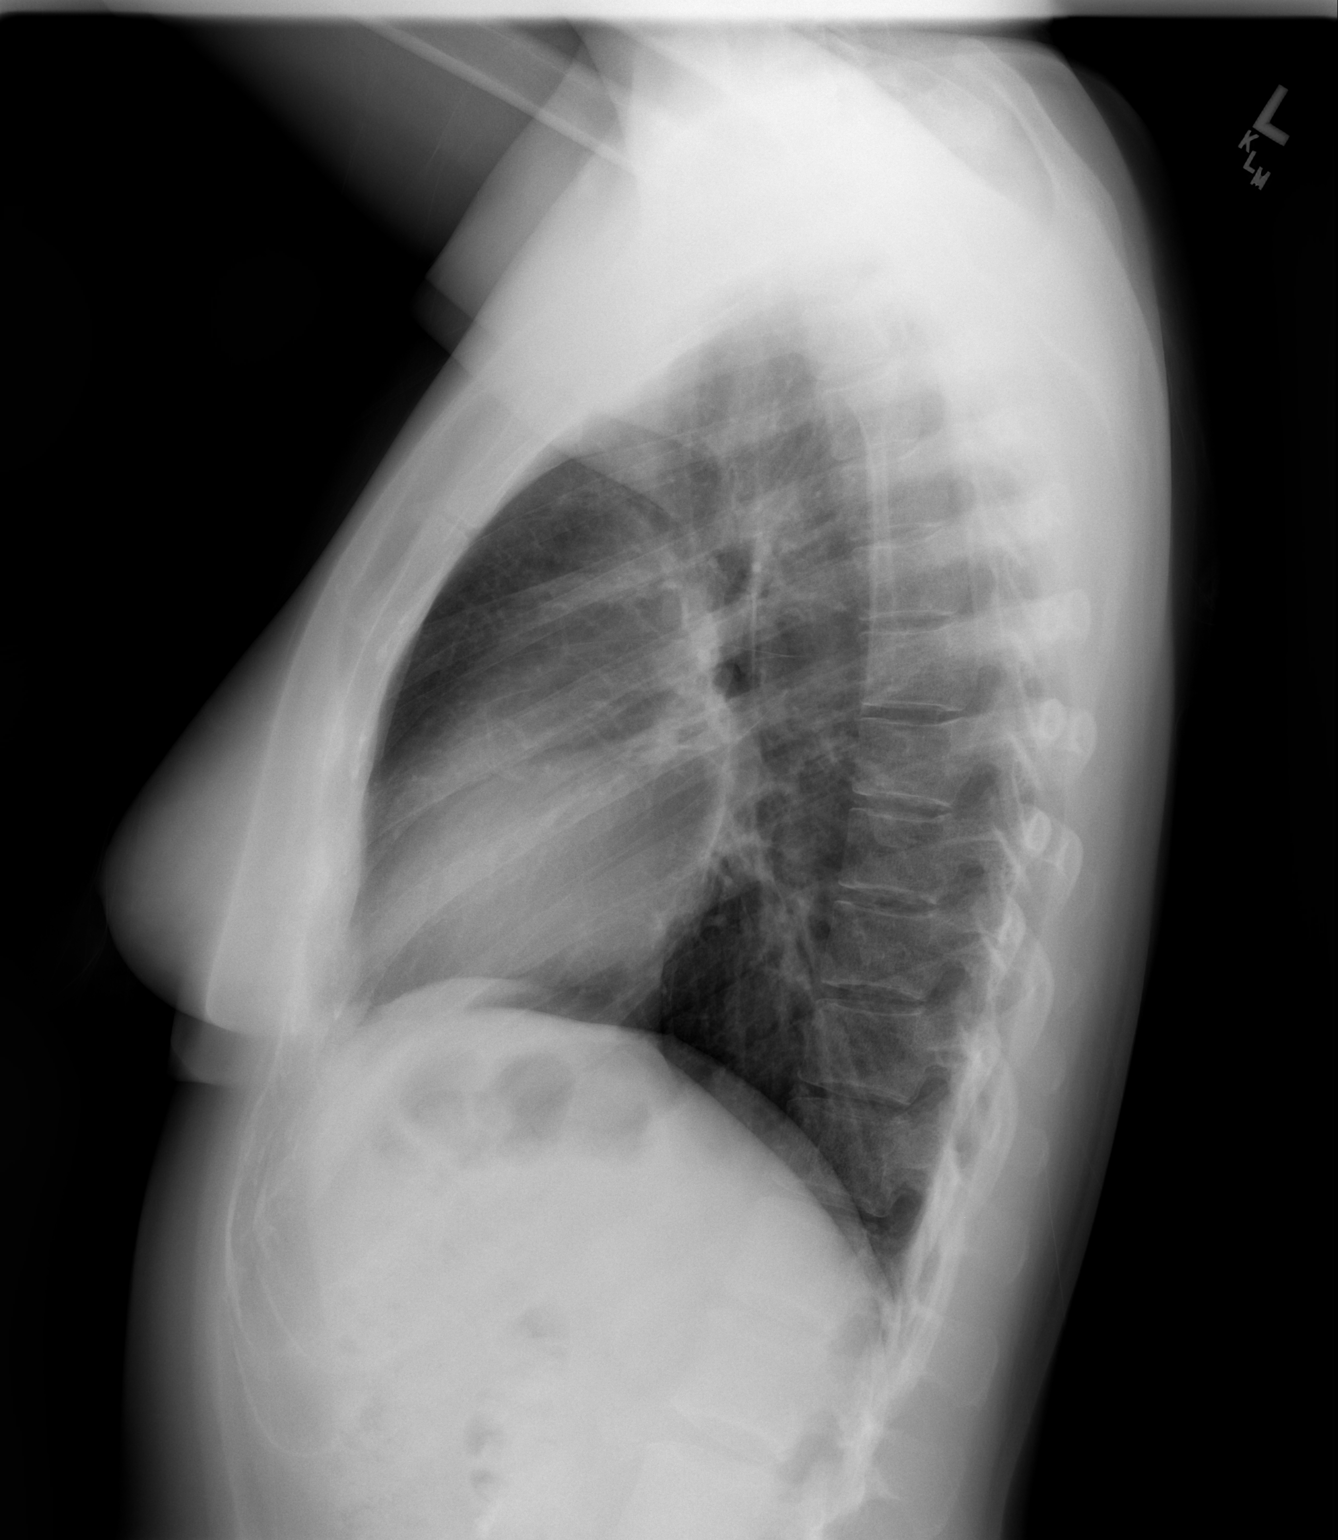

[2 of 2 positions shown; findings below may reference images not displayed]

FINDINGS: Midline trachea.  Normal heart size and mediastinal
contours. No pleural effusion or pneumothorax.  Clear lungs.
IMPRESSION: Normal chest.

## 2013-01-16 ENCOUNTER — Other Ambulatory Visit (HOSPITAL_COMMUNITY): Payer: Self-pay | Admitting: Physician Assistant

## 2013-01-16 DIAGNOSIS — Z0489 Encounter for examination and observation for other specified reasons: Secondary | ICD-10-CM

## 2013-01-18 ENCOUNTER — Ambulatory Visit (HOSPITAL_COMMUNITY)
Admission: RE | Admit: 2013-01-18 | Discharge: 2013-01-18 | Disposition: A | Discharge status: Home / Self Care | Payer: Self-pay | Source: Ambulatory Visit | Attending: Physician Assistant | Admitting: Physician Assistant

## 2013-01-18 DIAGNOSIS — O358XX0 Maternal care for other (suspected) fetal abnormality and damage, not applicable or unspecified: Secondary | ICD-10-CM | POA: Insufficient documentation

## 2013-01-18 DIAGNOSIS — Z1389 Encounter for screening for other disorder: Secondary | ICD-10-CM | POA: Insufficient documentation

## 2013-01-18 DIAGNOSIS — Z363 Encounter for antenatal screening for malformations: Secondary | ICD-10-CM | POA: Insufficient documentation

## 2013-01-18 DIAGNOSIS — Z0489 Encounter for examination and observation for other specified reasons: Secondary | ICD-10-CM

## 2013-01-22 LAB — OB RESULTS CONSOLE RPR: RPR: NONREACTIVE

## 2013-05-15 ENCOUNTER — Inpatient Hospital Stay (HOSPITAL_COMMUNITY)
Admission: AD | Admit: 2013-05-15 | Discharge: 2013-05-16 | Disposition: A | Discharge status: Home / Self Care | Payer: Medicaid Other | Source: Ambulatory Visit | Attending: Obstetrics & Gynecology | Admitting: Obstetrics & Gynecology

## 2013-05-15 ENCOUNTER — Encounter (HOSPITAL_COMMUNITY): Payer: Self-pay | Admitting: General Practice

## 2013-05-15 DIAGNOSIS — O479 False labor, unspecified: Secondary | ICD-10-CM | POA: Insufficient documentation

## 2013-06-05 ENCOUNTER — Encounter (HOSPITAL_COMMUNITY): Payer: Self-pay

## 2013-06-05 ENCOUNTER — Inpatient Hospital Stay (HOSPITAL_COMMUNITY)
Admission: AD | Admit: 2013-06-05 | Discharge: 2013-06-07 | DRG: 775 | Disposition: A | Discharge status: Home / Self Care | Payer: Medicaid Other | Source: Ambulatory Visit | Attending: Obstetrics & Gynecology | Admitting: Obstetrics & Gynecology

## 2013-06-05 DIAGNOSIS — O9903 Anemia complicating the puerperium: Secondary | ICD-10-CM

## 2013-06-05 DIAGNOSIS — D649 Anemia, unspecified: Secondary | ICD-10-CM | POA: Diagnosis not present

## 2013-06-05 DIAGNOSIS — IMO0001 Reserved for inherently not codable concepts without codable children: Secondary | ICD-10-CM

## 2013-06-05 LAB — RPR: RPR Ser Ql: NONREACTIVE

## 2013-06-05 LAB — CBC
Hemoglobin: 12.2 g/dL (ref 12.0–15.0)
MCH: 28.7 pg (ref 26.0–34.0)
MCHC: 33.8 g/dL (ref 30.0–36.0)
Platelets: 161 10*3/uL (ref 150–400)
RDW: 14.1 % (ref 11.5–15.5)
WBC: 9.9 10*3/uL (ref 4.0–10.5)

## 2013-06-05 LAB — POCT FERN TEST: POCT Fern Test: NEGATIVE

## 2013-06-05 MED ORDER — TETANUS-DIPHTH-ACELL PERTUSSIS 5-2.5-18.5 LF-MCG/0.5 IM SUSP: 0.5000 mL | Freq: Once | INTRAMUSCULAR | Status: DC

## 2013-06-05 MED ORDER — DIBUCAINE 1 % RE OINT: 1.0000 "application " | TOPICAL_OINTMENT | RECTAL | Status: DC | PRN

## 2013-06-05 MED ORDER — OXYTOCIN 40 UNITS IN LACTATED RINGERS INFUSION - SIMPLE MED
1.0000 m[IU]/min | INTRAVENOUS | Status: DC
  Administered 2013-06-05: 1 m[IU]/min via INTRAVENOUS

## 2013-06-05 MED ORDER — FENTANYL CITRATE 0.05 MG/ML IJ SOLN
100.0000 ug | INTRAMUSCULAR | Status: DC | PRN
  Administered 2013-06-05: 100 ug via INTRAVENOUS
  Filled 2013-06-05: qty 2

## 2013-06-05 MED ORDER — LANOLIN HYDROUS EX OINT: TOPICAL_OINTMENT | CUTANEOUS | Status: DC | PRN

## 2013-06-05 MED ORDER — OXYTOCIN BOLUS FROM INFUSION: 500.0000 mL | INTRAVENOUS | Status: DC

## 2013-06-05 MED ORDER — DIPHENHYDRAMINE HCL 25 MG PO CAPS: 25.0000 mg | ORAL_CAPSULE | Freq: Four times a day (QID) | ORAL | Status: DC | PRN

## 2013-06-05 MED ORDER — DIPHENHYDRAMINE HCL 50 MG/ML IJ SOLN: 12.5000 mg | INTRAMUSCULAR | Status: DC | PRN

## 2013-06-05 MED ORDER — EPHEDRINE 5 MG/ML INJ
10.0000 mg | INTRAVENOUS | Status: DC | PRN
  Filled 2013-06-05: qty 2

## 2013-06-05 MED ORDER — PENICILLIN G POTASSIUM 5000000 UNITS IJ SOLR
5.0000 10*6.[IU] | Freq: Once | INTRAVENOUS | Status: DC
  Filled 2013-06-05: qty 5

## 2013-06-05 MED ORDER — BENZOCAINE-MENTHOL 20-0.5 % EX AERO: 1.0000 "application " | INHALATION_SPRAY | CUTANEOUS | Status: DC | PRN

## 2013-06-05 MED ORDER — ONDANSETRON HCL 4 MG/2ML IJ SOLN: 4.0000 mg | INTRAMUSCULAR | Status: DC | PRN

## 2013-06-05 MED ORDER — IBUPROFEN 600 MG PO TABS
600.0000 mg | ORAL_TABLET | Freq: Four times a day (QID) | ORAL | Status: DC | PRN
  Administered 2013-06-05: 600 mg via ORAL
  Filled 2013-06-05: qty 1

## 2013-06-05 MED ORDER — NALBUPHINE SYRINGE 5 MG/0.5 ML
5.0000 mg | INJECTION | INTRAMUSCULAR | Status: DC | PRN
  Filled 2013-06-05: qty 0.5

## 2013-06-05 MED ORDER — PRENATAL MULTIVITAMIN CH: 1.0000 | ORAL_TABLET | Freq: Every day | ORAL | Status: DC

## 2013-06-05 MED ORDER — OXYCODONE-ACETAMINOPHEN 5-325 MG PO TABS: 1.0000 | ORAL_TABLET | ORAL | Status: DC | PRN

## 2013-06-05 MED ORDER — ONDANSETRON HCL 4 MG PO TABS: 4.0000 mg | ORAL_TABLET | ORAL | Status: DC | PRN

## 2013-06-05 MED ORDER — PHENYLEPHRINE 40 MCG/ML (10ML) SYRINGE FOR IV PUSH (FOR BLOOD PRESSURE SUPPORT)
80.0000 ug | PREFILLED_SYRINGE | INTRAVENOUS | Status: DC | PRN
  Filled 2013-06-05: qty 2

## 2013-06-05 MED ORDER — CITRIC ACID-SODIUM CITRATE 334-500 MG/5ML PO SOLN: 30.0000 mL | ORAL | Status: DC | PRN

## 2013-06-05 MED ORDER — WITCH HAZEL-GLYCERIN EX PADS: 1.0000 "application " | MEDICATED_PAD | CUTANEOUS | Status: DC | PRN

## 2013-06-05 MED ORDER — ONDANSETRON HCL 4 MG/2ML IJ SOLN: 4.0000 mg | Freq: Four times a day (QID) | INTRAMUSCULAR | Status: DC | PRN

## 2013-06-05 MED ORDER — OXYCODONE-ACETAMINOPHEN 5-325 MG PO TABS
1.0000 | ORAL_TABLET | ORAL | Status: DC | PRN
  Administered 2013-06-05: 1 via ORAL
  Filled 2013-06-05: qty 1

## 2013-06-05 MED ORDER — ZOLPIDEM TARTRATE 5 MG PO TABS: 5.0000 mg | ORAL_TABLET | Freq: Every evening | ORAL | Status: DC | PRN

## 2013-06-05 MED ORDER — SIMETHICONE 80 MG PO CHEW: 80.0000 mg | CHEWABLE_TABLET | ORAL | Status: DC | PRN

## 2013-06-05 MED ORDER — BENZOCAINE-MENTHOL 20-0.5 % EX AERO
1.0000 "application " | INHALATION_SPRAY | CUTANEOUS | Status: DC | PRN
  Filled 2013-06-05: qty 56

## 2013-06-05 MED ORDER — LACTATED RINGERS IV SOLN: 500.0000 mL | INTRAVENOUS | Status: DC | PRN

## 2013-06-05 MED ORDER — FENTANYL 2.5 MCG/ML BUPIVACAINE 1/10 % EPIDURAL INFUSION (WH - ANES): 14.0000 mL/h | INTRAMUSCULAR | Status: DC | PRN

## 2013-06-05 MED ORDER — ACETAMINOPHEN 325 MG PO TABS: 650.0000 mg | ORAL_TABLET | ORAL | Status: DC | PRN

## 2013-06-05 MED ORDER — LIDOCAINE HCL (PF) 1 % IJ SOLN
30.0000 mL | INTRAMUSCULAR | Status: AC | PRN
  Administered 2013-06-05 (×2): 30 mL via SUBCUTANEOUS
  Filled 2013-06-05 (×2): qty 30

## 2013-06-05 MED ORDER — OXYTOCIN 40 UNITS IN LACTATED RINGERS INFUSION - SIMPLE MED
62.5000 mL/h | INTRAVENOUS | Status: DC
  Filled 2013-06-05: qty 1000

## 2013-06-05 MED ORDER — IBUPROFEN 600 MG PO TABS
600.0000 mg | ORAL_TABLET | Freq: Four times a day (QID) | ORAL | Status: DC
  Administered 2013-06-06 – 2013-06-07 (×7): 600 mg via ORAL
  Filled 2013-06-05 (×7): qty 1

## 2013-06-05 MED ORDER — POLYETHYLENE GLYCOL 3350 17 G PO PACK
17.0000 g | PACK | Freq: Every day | ORAL | Status: DC
  Administered 2013-06-06 (×2): 17 g via ORAL
  Filled 2013-06-05 (×4): qty 1

## 2013-06-05 MED ORDER — SENNOSIDES-DOCUSATE SODIUM 8.6-50 MG PO TABS
2.0000 | ORAL_TABLET | ORAL | Status: DC
  Administered 2013-06-06 – 2013-06-07 (×2): 2 via ORAL
  Filled 2013-06-05 (×2): qty 2

## 2013-06-05 MED ORDER — PRENATAL MULTIVITAMIN CH
1.0000 | ORAL_TABLET | Freq: Every day | ORAL | Status: DC
  Administered 2013-06-06 – 2013-06-07 (×2): 1 via ORAL
  Filled 2013-06-05 (×2): qty 1

## 2013-06-05 MED ORDER — LACTATED RINGERS IV SOLN
INTRAVENOUS | Status: DC
  Administered 2013-06-05: 125 mL/h via INTRAVENOUS

## 2013-06-05 MED ORDER — PENICILLIN G POTASSIUM 5000000 UNITS IJ SOLR
2.5000 10*6.[IU] | INTRAVENOUS | Status: DC
  Filled 2013-06-05 (×2): qty 2.5

## 2013-06-05 MED ORDER — TERBUTALINE SULFATE 1 MG/ML IJ SOLN: 0.2500 mg | Freq: Once | INTRAMUSCULAR | Status: DC | PRN

## 2013-06-05 MED ORDER — LACTATED RINGERS IV SOLN: 500.0000 mL | Freq: Once | INTRAVENOUS | Status: DC

## 2013-06-05 MED ORDER — IBUPROFEN 600 MG PO TABS: 600.0000 mg | ORAL_TABLET | Freq: Four times a day (QID) | ORAL | Status: DC

## 2013-06-05 NOTE — MAU Note (Signed)
Pt states via interpreter that contractions began at 11pm and were every 10 mins apart. States between 1230 and 1 they were every 5 mins apart. Denies LOF and vaginal bleeding. States she is leaking some urine and having pressure. States good FM.

## 2013-06-05 NOTE — Progress Notes (Signed)
Robin Alvarado is a 32 y.o. G2P1001 at [redacted]w[redacted]d admitted for latent labor at term.   Subjective:  Starting to hurt more in the last 30 min.  +FM.   Objective: BP 137/85  Pulse 82  Temp(Src) 98.2 F (36.8 C) (Oral)  Resp 22  Ht 5\' 4"  (1.626 m)  Wt 83.144 kg (183 lb 4.8 oz)  BMI 31.45 kg/m2  SpO2 100%      FHT:  FHR: 145 bpm, variability: moderate,  accelerations:  Present,  decelerations:  Absent UC:   In the last 30 min, now more every 2 min SVE:   Dilation: 6.5 Effacement (%): 90 Station: -2 Exam by:: Dr. Reola Calkins  Labs: Lab Results  Component Value Date   WBC 9.9 06/05/2013   HGB 12.2 06/05/2013   HCT 36.1 06/05/2013   MCV 84.9 06/05/2013   PLT 161 06/05/2013    Assessment / Plan: Augmentation of labor, progressing well  Labor: progressing on pitocin. IUPC placed as slow change but suspect she is jsut now in active labor in the last 30 min Fetal Wellbeing:  Category I Pain Control:  Fentanyl I/D:  n/a Anticipated MOD:  NSVD  Jarrick Fjeld L 06/05/2013, 4:22 PM

## 2013-06-05 NOTE — Progress Notes (Signed)
S. Feels her contractions only mildly. O. VSS, AF      FHR category 1      CVX 5/80/-1      AROM- clear  A/P. Latent labor. I will add pitocin augmentation and expect SVD Her pelvis is proven for 9# and I think this baby weighs less than 9 pounds.

## 2013-06-05 NOTE — MAU Note (Signed)
Per Dr Ike Bene, recheck pt in 1-2 hours. Pt may ambulate if she wants.

## 2013-06-05 NOTE — Progress Notes (Signed)
Pt received PNC at [facility] Department.  Results for HIV and GBS not found in patient record.  [facility] called and informed that HIV was negative on 01/22/13 and GBS was negative 05/11/13.  Awaiting official fax results. Dr. Reola Calkins made aware.  H Jagger Beahm RNC.

## 2013-06-05 NOTE — H&P (Signed)
Robin Alvarado is a 32 y.o. female G2P1001 with IUP at [redacted]w[redacted]d presenting for contractions. PT has been having regular contractions since 2am.  Got more frequent and stronger recently. Good FM. No LOF, VB.     PNCare at [facility] since 18 wks. Records not here but requested to be faxed. Per report, no abnormal labs. GBS neg. [place] normal.    Prenatal History/Complications:  Past Medical History: Past Medical History  Diagnosis Date  . No pertinent past medical history   . Medical history non-contributory     Past Surgical History: Past Surgical History  Procedure Laterality Date  . No past surgeries    . Tooth extraction      Obstetrical History: OB History   Grav Para Term Preterm Abortions TAB SAB Ect Mult Living   2 1 1  0 0 0 0 0 0 1    G1- NSVD, 9lb, 2nd degree lac G2- current    Social History: History   Social History  . Marital Status: Married    Spouse Name: N/A    Number of Children: N/A  . Years of Education: N/A   Social History Main Topics  . Smoking status: Never Smoker   . Smokeless tobacco: Never Used  . Alcohol Use: No  . Drug Use: No  . Sexual Activity: Yes    Birth Control/ Protection: None   Other Topics Concern  . None   Social History Narrative  . None    Family History: Family History  Problem Relation Age of Onset  . Hypertension Father   . Diabetes Other   . Anesthesia problems Neg Hx   . Other Neg Hx     Allergies: No Known Allergies  Prescriptions prior to admission  Medication Sig Dispense Refill  . Prenatal Vit-Fe Fumarate-FA (MULTIVITAMIN-PRENATAL) 27-0.8 MG TABS Take 1 tablet by mouth daily.           Review of Systems   Constitutional: Negative for fever, chills, weight loss, malaise/fatigue and diaphoresis.  HENT: Negative for hearing loss, ear pain, nosebleeds, congestion, sore throat, neck pain, tinnitus and ear discharge.   Eyes: Negative for blurred vision, double vision, photophobia, pain, discharge and  redness.  Respiratory: Negative for cough, hemoptysis, sputum production, shortness of breath, wheezing and stridor.   Cardiovascular: Negative for chest pain, palpitations, orthopnea,  leg swelling  Gastrointestinal: Positive for abdominal pain. Negative for heartburn, nausea, vomiting, diarrhea, constipation, blood in stool Genitourinary: Negative for dysuria, urgency, frequency, hematuria and flank pain.  Musculoskeletal: Negative for myalgias, back pain, joint pain and falls.  Skin: Negative for itching and rash.  Neurological: Negative for dizziness, tingling, tremors, sensory change, speech change, focal weakness, seizures, loss of consciousness, weakness and headaches.  Endo/Heme/Allergies: Negative for environmental allergies and polydipsia. Does not bruise/bleed easily.  Psychiatric/Behavioral: Negative for depression, suicidal ideas, hallucinations, memory loss and substance abuse. The patient is not nervous/anxious and does not have insomnia.       Blood pressure 126/83, pulse 80, temperature 98.2 F (36.8 C), temperature source Oral, resp. rate 18, height 5\' 4"  (1.626 m), weight 83.144 kg (183 lb 4.8 oz), SpO2 100.00%. General appearance: alert, cooperative and no distress Lungs: clear to auscultation bilaterally Heart: regular rate and rhythm Abdomen: soft, non-tender; bowel sounds normal Extremities: Homans sign is negative, no sign of DVT Presentation: cephalic Fetal monitoringBaseline: 150 bpm, Variability: Good {> 6 bpm), Accelerations: Reactive and Decelerations: Absent Uterine activity every 3-4 min  Dilation: 5 Effacement (%): 70 Station: -2 Exam  by:: Dr Reola Calkins   Prenatal labs: ABO, Rh:   Antibody:   Rubella:   RPR: Nonreactive (06/29 0000)  HBsAg:    HIV:    GBS: Negative (10/16 0000)  1 hr Glucola unknown currently Genetic screening  unknown Anatomy US normal    Assessment: Robin Alvarado is a 32 y.o. G2P1001 with an IUP at [redacted]w[redacted]d presenting  for labor at term.   Plan: **labor at term - admit to L&D - routine orders - obtain labs from [facility] - desires IV pain meds  **FWB - cat I tracing  - GBS neg (confirmed)  - EFW 8.5#  ** anticipate SVD  Vladimir Lenhoff L, MD 06/05/2013, 9:58 AM

## 2013-06-05 NOTE — MAU Note (Signed)
Pt called out saying she thinks her water broke. Robin Alvarado CNM in to do speculum exam to r/o SROM

## 2013-06-05 NOTE — MAU Provider Note (Signed)
  History     CSN: [id number]  Arrival date and time: 06/05/13 4098   None     Chief Complaint  Patient presents with  . Labor Eval   HPI  Robin Alvarado is a 32 y.o. G2P1001 at [redacted]w[redacted]d who has been here for a labor evaluation. She was ambulating and felt a large gush of fluid.   Past Medical History  Diagnosis Date  . No pertinent past medical history   . Medical history non-contributory     Past Surgical History  Procedure Laterality Date  . No past surgeries    . Tooth extraction      Family History  Problem Relation Age of Onset  . Hypertension Father   . Diabetes Other   . Anesthesia problems Neg Hx   . Other Neg Hx     History  Substance Use Topics  . Smoking status: Never Smoker   . Smokeless tobacco: Never Used  . Alcohol Use: No    Allergies: No Known Allergies  Prescriptions prior to admission  Medication Sig Dispense Refill  . Prenatal Vit-Fe Fumarate-FA (MULTIVITAMIN-PRENATAL) 27-0.8 MG TABS Take 1 tablet by mouth daily.          ROS Physical Exam   Blood pressure 134/84, pulse 69, temperature 98.1 F (36.7 C), temperature source Oral, resp. rate 18, height 5\' 4"  (1.626 m), weight 83.144 kg (183 lb 4.8 oz), SpO2 100.00%.  Physical Exam  Nursing note and vitals reviewed. Constitutional: She appears well-developed and well-nourished. No distress.  Cardiovascular: Normal rate.   Respiratory: Effort normal.  GI: Soft. There is no tenderness.  Genitourinary:   External: no lesion Vagina: small amount of white discharge, no pooling  Cervix: pink, smooth, no fluid seen with valsalva  Uterus: AGA  FHT: 145, moderate with 15x15 accels, no decels Toco: q3-4 mins   Neurological: She is alert.  Skin: Skin is warm and dry.  Psychiatric: She has a normal mood and affect.    MAU Course  Procedures  Results for orders placed during the hospital encounter of 06/05/13 (from the past 24 hour(s))  AMNISURE RUPTURE OF MEMBRANE (ROM)      Status: None   Collection Time    06/05/13  6:50 AM      Result Value Range   Amnisure ROM NEGATIVE    POCT FERN TEST     Status: None   Collection Time    06/05/13  7:04 AM      Result Value Range   POCT Fern Test Negative = intact amniotic membranes     0741: Cervix: 4.5/90/-1 This is a change from before, and she continues to have frequent contractions.   Assessment and Plan  Active labor at term Admit to labor and delivery PCN for GBS Anticipate NSVB  Tawnya Crook 06/05/2013, 7:09 AM

## 2013-06-06 LAB — CBC
HCT: 26.7 % — ABNORMAL LOW (ref 36.0–46.0)
Hemoglobin: 9.2 g/dL — ABNORMAL LOW (ref 12.0–15.0)
MCV: 85 fL (ref 78.0–100.0)
Platelets: 141 10*3/uL — ABNORMAL LOW (ref 150–400)
WBC: 10.4 10*3/uL (ref 4.0–10.5)

## 2013-06-06 NOTE — Progress Notes (Signed)
I spoke with and examined patient and agree with PA-S's note and plan of care.  Tawana Scale, MD Ob Fellow 06/06/2013 8:31 AM

## 2013-06-06 NOTE — Lactation Note (Signed)
This note was copied from the chart of Robin Alvarado. Lactation Consultation Note Initial consultation. Mom states breast feeding is going very well. Mom states baby just finished a 15 minute feeding, and is sleeping now, dad holding, mom resting. Mom states she does not feel any pain with latch, and she does hear swallows. Mom states she knows how to hand express. Br feeding basics / baby and me book reviewed with mom in [place]. Lactation brochure, breastfeeding clinic, BFSG and community resources reviewed with mom.   Enc mom to call for help if needed. Mom states she does not have any questions or concerns at this time. Dad at bedside to assist with translation.   Patient Name: Robin Deana Krock Today's Date: 06/06/2013 Reason for consult: Initial assessment   Maternal Data Formula Feeding for Exclusion: No Infant to breast within first hour of birth: No Breastfeeding delayed due to::  (did not latch) Has patient been taught Hand Expression?: Yes Does the patient have breastfeeding experience prior to this delivery?: Yes  Feeding Length of feed: 15 min  LATCH Score/Interventions                      Lactation Tools Discussed/Used     Consult Status Consult Status: PRN    Lenard Forth 06/06/2013, 1:56 PM

## 2013-06-06 NOTE — Progress Notes (Signed)
Post Partum Day 1 Subjective:  Robin Alvarado is a 32 y.o. [phone] who had a SVD complicated by a 30 second shoulder dystocia which was manually corrected. She has no complaints today, is interacting well with baby, and successfully breastfeeding. She was interviewed with Eda, the Spanish interpreter, as she does not speak [place]. ROS negative for SOB, headache, vision changes, chest pain, calf pain / lower extremity swelling.  Objective: Blood pressure 114/74, pulse 79, temperature 97.9 F (36.6 C), temperature source Oral, resp. rate 16, height 5\' 4"  (1.626 m), weight 83.144 kg (183 lb 4.8 oz), SpO2 98.00%, unknown if currently breastfeeding.  Physical Exam:  General: alert, cooperative, appears stated age and no distress Lochia: appropriate; pt states bleeding "slowing since yesterday" Uterine Fundus: firm Incision: NA DVT Evaluation: No evidence of DVT seen on physical exam. Negative Homan's sign. No cords or calf tenderness. No significant calf/ankle edema. Dorsalis Pedis pulses present bilaterally with regular rate and contour. Cardiac: Normal S1/S2 and rate; no S3/S4, rubs, murmurs. Lungs: clear to auscultation bilaterally   Recent Labs  06/05/13 0830 06/06/13 0543  HGB 12.2 9.2*  HCT 36.1 26.7*    Assessment/Plan: Plan for discharge tomorrow, Breastfeeding and Contraception Progestin Only Oral Contraceptive Pills   LOS: 1 day   Jefm Petty 06/06/2013, 7:40 AM

## 2013-06-06 NOTE — MAU Provider Note (Signed)
Attestation of Attending Supervision of Advanced Practitioner (CNM/NP): Evaluation and management procedures were performed by the Advanced Practitioner under my supervision and collaboration. I have reviewed the Advanced Practitioner's note and chart, and I agree with the management and plan.  Casee Knepp H. 5:49 PM   

## 2013-06-06 NOTE — Progress Notes (Signed)
UR chart review completed.  

## 2013-06-07 MED ORDER — DOCUSATE SODIUM 100 MG PO CAPS: 100.0000 mg | ORAL_CAPSULE | Freq: Two times a day (BID) | ORAL | Status: AC | PRN

## 2013-06-07 MED ORDER — NORETHINDRONE 0.35 MG PO TABS: 1.0000 | ORAL_TABLET | Freq: Every day | ORAL | Status: DC

## 2013-06-07 MED ORDER — IBUPROFEN 600 MG PO TABS: 600.0000 mg | ORAL_TABLET | Freq: Four times a day (QID) | ORAL | Status: DC

## 2013-06-07 NOTE — Discharge Summary (Signed)
Obstetric Discharge Summary Reason for Admission: onset of labor Prenatal Procedures:  ultrasound Intrapartum Procedures: spontaneous vaginal delivery Postpartum Procedures: none Complications-Operative and Postpartum: 3rd degree perineal laceration Hemoglobin  Date Value Range Status  06/06/2013 9.2* 12.0 - 15.0 g/dL Final     REPEATED TO VERIFY     DELTA CHECK NOTED     HCT  Date Value Range Status  06/06/2013 26.7* 36.0 - 46.0 % Final    Physical Exam:  General: alert, cooperative and no distress Lochia: appropriate Uterine Fundus: firm Incision: NA DVT Evaluation: Negative Homan's sign. No significant calf/ankle edema.  Discharge Diagnoses: Term Pregnancy-delivered and anemia  Discharge Information: Date: 06/07/2013 Activity: unrestricted and pelvic rest Diet: routine and iron-rich Medications: PNV, Ibuprofen, Colace and Iron Condition: stable Instructions: refer to practice specific booklet Discharge to: home Follow-up Information   Follow up with [facility] In 6 weeks.   Contact information:   [address] [place] [address] [phone]      Follow up with [facility] [place] [facility]ERN[facility] ADMISS[facility]. (As needed in emergencies)    Contact information:   [address] [id number] [place] [address] [phone]      Newborn Data: Live born female  Birth Weight: 9 lb 1.9 oz (4136 g) APGAR: 8, 9  Home with mother. Breastfeeding  Shanell Aden 06/07/2013, 7:55 AM

## 2013-06-08 NOTE — Discharge Summary (Signed)
Attestation of Attending Supervision of Advanced Practitioner (PA/CNM/NP): Evaluation and management procedures were performed by the Advanced Practitioner under my supervision and collaboration.  I have reviewed the Advanced Practitioner's note and chart, and I agree with the management and plan.  UGONNA  ANYANWU, MD, FACOG Attending Obstetrician & Gynecologist Faculty Practice, [facility]   

## 2014-05-28 ENCOUNTER — Encounter (HOSPITAL_COMMUNITY): Payer: Self-pay

## 2014-07-27 NOTE — L&D Delivery Note (Signed)
Patient is 34 y.o. [id number] [redacted]w[redacted]d admitted for induction of labor due to postdates. At presentation, she was found to be in breech presentation and underwent successful ECV/   Delivery Note At 5:29 PM a viable female was delivered via Vaginal, Spontaneous Delivery (Presentation:OA).  APGAR: 7, 9; weight pending.   Placenta status: Intact, Spontaneous.  Cord: 3 vessels with the following complications: anterior shoulder dystocia x 1 minute  Patient was noted to be complete and +2 station. She delivered healthy female infant over intact perineum via OA presentation. Anterior shoulder dystocia x 1 minute was reduced with McRobert's maneuver, suprapubic pressure and [place] maneuver. Nuchal cord x 1; infant delivered through with summersault maneuver. Remainder of infant was extracted from vagina without difficulty. Delayed cord clamping was performed after approximately 2 minutes. Uterus massaged and noted to be firm but with persistent bleeding. Uterus massaged again and noted to be boggy. Bimanual exam with removal of approximately 10 cc of clot from cervical os. Uterus still intermittently boggy. Cytotec 1000 mcg PR was given. Uterus massaged and noted to be firm with decreased bleeding. Perineal laceration with bleeding was repaired with 3.0 vicryl with good hemostasis at laceration site. Uterus massaged and again noted to be intermittently boggy with active bleeding. Bimanual exam with 20 cc of clot removed from cervical os; bimanual massage with uterus firm but boggy when massage halted. Patient given IM methergine. Uterus massaged and firmer with significantly decreased bleeding. Patient will continue on methergine series x 24 hours postpartum.   Anesthesia: Local  Episiotomy: None Lacerations: 1st degree;Perineal Suture Repair: 3.0 vicryl Est. Blood Loss (mL):  800 mL  Mom to postpartum.  Baby to Couplet care / Skin to Skin.  Delivery supervised by Illene Bolus, CNM Fabio Asa 01/04/2015, 6:07 PM

## 2014-08-02 ENCOUNTER — Other Ambulatory Visit (HOSPITAL_COMMUNITY): Payer: Self-pay | Admitting: Physician Assistant

## 2014-08-02 DIAGNOSIS — Z3689 Encounter for other specified antenatal screening: Secondary | ICD-10-CM

## 2014-08-02 DIAGNOSIS — R7611 Nonspecific reaction to tuberculin skin test without active tuberculosis: Secondary | ICD-10-CM

## 2014-08-02 HISTORY — DX: Nonspecific reaction to tuberculin skin test without active tuberculosis: R76.11

## 2014-08-02 LAB — OB RESULTS CONSOLE HEPATITIS B SURFACE ANTIGEN: HEP B S AG: NEGATIVE

## 2014-08-02 LAB — OB RESULTS CONSOLE RUBELLA ANTIBODY, IGM: Rubella: IMMUNE

## 2014-08-02 LAB — OB RESULTS CONSOLE ABO/RH: RH Type: POSITIVE

## 2014-08-02 LAB — OB RESULTS CONSOLE ANTIBODY SCREEN: Antibody Screen: NEGATIVE

## 2014-08-02 LAB — OB RESULTS CONSOLE HIV ANTIBODY (ROUTINE TESTING): HIV: NONREACTIVE

## 2014-08-02 LAB — OB RESULTS CONSOLE RPR: RPR: NONREACTIVE

## 2014-08-02 LAB — OB RESULTS CONSOLE GC/CHLAMYDIA
CHLAMYDIA, DNA PROBE: NEGATIVE
Gonorrhea: NEGATIVE

## 2014-08-09 ENCOUNTER — Ambulatory Visit (HOSPITAL_COMMUNITY)
Admission: RE | Admit: 2014-08-09 | Discharge: 2014-08-09 | Disposition: A | Discharge status: Home / Self Care | Payer: Medicaid Other | Source: Ambulatory Visit | Attending: Physician Assistant | Admitting: Physician Assistant

## 2014-08-09 DIAGNOSIS — Z3A19 19 weeks gestation of pregnancy: Secondary | ICD-10-CM | POA: Insufficient documentation

## 2014-08-09 DIAGNOSIS — Z36 Encounter for antenatal screening of mother: Secondary | ICD-10-CM | POA: Insufficient documentation

## 2014-08-09 DIAGNOSIS — Z3689 Encounter for other specified antenatal screening: Secondary | ICD-10-CM | POA: Insufficient documentation

## 2014-08-14 ENCOUNTER — Other Ambulatory Visit (HOSPITAL_COMMUNITY): Payer: Self-pay | Admitting: Physician Assistant

## 2014-08-14 DIAGNOSIS — IMO0002 Reserved for concepts with insufficient information to code with codable children: Secondary | ICD-10-CM

## 2014-08-14 DIAGNOSIS — Z0489 Encounter for examination and observation for other specified reasons: Secondary | ICD-10-CM

## 2014-09-06 ENCOUNTER — Ambulatory Visit (HOSPITAL_COMMUNITY)
Admission: RE | Admit: 2014-09-06 | Discharge: 2014-09-06 | Disposition: A | Discharge status: Home / Self Care | Payer: Medicaid Other | Source: Ambulatory Visit | Attending: Physician Assistant | Admitting: Physician Assistant

## 2014-09-06 DIAGNOSIS — Z3A23 23 weeks gestation of pregnancy: Secondary | ICD-10-CM | POA: Insufficient documentation

## 2014-09-06 DIAGNOSIS — Z0489 Encounter for examination and observation for other specified reasons: Secondary | ICD-10-CM | POA: Insufficient documentation

## 2014-09-06 DIAGNOSIS — IMO0002 Reserved for concepts with insufficient information to code with codable children: Secondary | ICD-10-CM

## 2014-09-06 DIAGNOSIS — Z36 Encounter for antenatal screening of mother: Secondary | ICD-10-CM | POA: Insufficient documentation

## 2014-12-06 LAB — OB RESULTS CONSOLE GC/CHLAMYDIA
Chlamydia: NEGATIVE
Gonorrhea: NEGATIVE

## 2014-12-06 LAB — OB RESULTS CONSOLE GBS: STREP GROUP B AG: NEGATIVE

## 2014-12-25 ENCOUNTER — Inpatient Hospital Stay (HOSPITAL_COMMUNITY)
Admission: AD | Admit: 2014-12-25 | Discharge: 2014-12-25 | Disposition: A | Discharge status: Home / Self Care | Payer: Medicaid Other | Source: Ambulatory Visit | Attending: Obstetrics and Gynecology | Admitting: Obstetrics and Gynecology

## 2014-12-25 ENCOUNTER — Encounter (HOSPITAL_COMMUNITY): Payer: Self-pay

## 2014-12-25 DIAGNOSIS — Z3A39 39 weeks gestation of pregnancy: Secondary | ICD-10-CM | POA: Insufficient documentation

## 2014-12-25 DIAGNOSIS — O9989 Other specified diseases and conditions complicating pregnancy, childbirth and the puerperium: Secondary | ICD-10-CM | POA: Insufficient documentation

## 2014-12-25 NOTE — MAU Note (Signed)
Contractions every 5 min since 12 midnight.  No leaking. No bleeding. Baby moving well.

## 2014-12-25 NOTE — Discharge Instructions (Signed)
Braxton Hicks Contractions °Contractions of the uterus can occur throughout pregnancy. Contractions are not always a sign that you are in labor.  °WHAT ARE BRAXTON HICKS CONTRACTIONS?  °Contractions that occur before labor are called Braxton Hicks contractions, or false labor. Toward the end of pregnancy (32-34 weeks), these contractions can develop more often and may become more forceful. This is not true labor because these contractions do not result in opening (dilatation) and thinning of the cervix. They are sometimes difficult to tell apart from true labor because these contractions can be forceful and people have different pain tolerances. You should not feel embarrassed if you go to the hospital with false labor. Sometimes, the only way to tell if you are in true labor is for your health care provider to look for changes in the cervix. °If there are no prenatal problems or other health problems associated with the pregnancy, it is completely safe to be sent home with false labor and await the onset of true labor. °HOW CAN YOU TELL THE DIFFERENCE BETWEEN TRUE AND FALSE LABOR? °False Labor °· The contractions of false labor are usually shorter and not as hard as those of true labor.   °· The contractions are usually irregular.   °· The contractions are often felt in the front of the lower abdomen and in the groin.   °· The contractions may go away when you walk around or change positions while lying down.   °· The contractions get weaker and are shorter lasting as time goes on.   °· The contractions do not usually become progressively stronger, regular, and closer together as with true labor.   °True Labor °· Contractions in true labor last 30-70 seconds, become very regular, usually become more intense, and increase in frequency.   °· The contractions do not go away with walking.   °· The discomfort is usually felt in the top of the uterus and spreads to the lower abdomen and low back.   °· True labor can be  determined by your health care provider with an exam. This will show that the cervix is dilating and getting thinner.   °WHAT TO REMEMBER °· Keep up with your usual exercises and follow other instructions given by your health care provider.   °· Take medicines as directed by your health care provider.   °· Keep your regular prenatal appointments.   °· Eat and drink lightly if you think you are going into labor.   °· If Braxton Hicks contractions are making you uncomfortable:   °¨ Change your position from lying down or resting to walking, or from walking to resting.   °¨ Sit and rest in a tub of warm water.   °¨ Drink 2-3 glasses of water. Dehydration may cause these contractions.   °¨ Do slow and deep breathing several times an hour.   °WHEN SHOULD I SEEK IMMEDIATE MEDICAL CARE? °Seek immediate medical care if: °· Your contractions become stronger, more regular, and closer together.   °· You have fluid leaking or gushing from your vagina.   °· You have a fever.   °· You pass blood-tinged mucus.   °· You have vaginal bleeding.   °· You have continuous abdominal pain.   °· You have low back pain that you never had before.   °· You feel your baby's head pushing down and causing pelvic pressure.   °· Your baby is not moving as much as it used to.   °Document Released: 07/13/2005 Document Revised: 07/18/2013 Document Reviewed: 04/24/2013 °ExitCare® Patient Information ©2015 [employer]. This information is not intended to replace advice given to you by your health care   provider. Make sure you discuss any questions you have with your health care provider. ° °

## 2014-12-31 ENCOUNTER — Other Ambulatory Visit (HOSPITAL_COMMUNITY): Payer: Self-pay | Admitting: Nurse Practitioner

## 2014-12-31 DIAGNOSIS — O480 Post-term pregnancy: Secondary | ICD-10-CM

## 2015-01-03 ENCOUNTER — Inpatient Hospital Stay (HOSPITAL_COMMUNITY)
Admission: AD | Admit: 2015-01-03 | Discharge: 2015-01-06 | DRG: 774 | Disposition: A | Discharge status: Home / Self Care | Payer: Medicaid Other | Source: Ambulatory Visit | Attending: Family Medicine | Admitting: Family Medicine

## 2015-01-03 ENCOUNTER — Encounter (HOSPITAL_COMMUNITY): Payer: Self-pay | Admitting: *Deleted

## 2015-01-03 ENCOUNTER — Telehealth (HOSPITAL_COMMUNITY): Payer: Self-pay | Admitting: *Deleted

## 2015-01-03 DIAGNOSIS — O321XX0 Maternal care for breech presentation, not applicable or unspecified: Secondary | ICD-10-CM | POA: Diagnosis present

## 2015-01-03 DIAGNOSIS — Z3490 Encounter for supervision of normal pregnancy, unspecified, unspecified trimester: Secondary | ICD-10-CM

## 2015-01-03 DIAGNOSIS — O480 Post-term pregnancy: Principal | ICD-10-CM | POA: Diagnosis present

## 2015-01-03 DIAGNOSIS — Z833 Family history of diabetes mellitus: Secondary | ICD-10-CM

## 2015-01-03 DIAGNOSIS — Z8249 Family history of ischemic heart disease and other diseases of the circulatory system: Secondary | ICD-10-CM

## 2015-01-03 DIAGNOSIS — Z3A40 40 weeks gestation of pregnancy: Secondary | ICD-10-CM | POA: Diagnosis present

## 2015-01-03 NOTE — Telephone Encounter (Signed)
Preadmission screen Interpreter number [phone]

## 2015-01-04 ENCOUNTER — Ambulatory Visit (HOSPITAL_COMMUNITY): Payer: Medicaid Other

## 2015-01-04 ENCOUNTER — Encounter (HOSPITAL_COMMUNITY): Payer: Self-pay | Admitting: *Deleted

## 2015-01-04 ENCOUNTER — Encounter (HOSPITAL_COMMUNITY): Payer: Self-pay | Admitting: Anesthesiology

## 2015-01-04 ENCOUNTER — Encounter (HOSPITAL_COMMUNITY)
Admission: AD | Disposition: A | Discharge status: Home / Self Care | Payer: Self-pay | Source: Ambulatory Visit | Attending: Family Medicine

## 2015-01-04 DIAGNOSIS — O321XX0 Maternal care for breech presentation, not applicable or unspecified: Secondary | ICD-10-CM | POA: Diagnosis present

## 2015-01-04 DIAGNOSIS — Z3A40 40 weeks gestation of pregnancy: Secondary | ICD-10-CM | POA: Diagnosis present

## 2015-01-04 DIAGNOSIS — Z8249 Family history of ischemic heart disease and other diseases of the circulatory system: Secondary | ICD-10-CM | POA: Diagnosis not present

## 2015-01-04 DIAGNOSIS — Z833 Family history of diabetes mellitus: Secondary | ICD-10-CM | POA: Diagnosis not present

## 2015-01-04 DIAGNOSIS — Z3490 Encounter for supervision of normal pregnancy, unspecified, unspecified trimester: Secondary | ICD-10-CM

## 2015-01-04 DIAGNOSIS — O480 Post-term pregnancy: Secondary | ICD-10-CM | POA: Diagnosis present

## 2015-01-04 HISTORY — DX: Maternal care for breech presentation, not applicable or unspecified: O32.1XX0

## 2015-01-04 HISTORY — DX: Encounter for supervision of normal pregnancy, unspecified, unspecified trimester: Z34.90

## 2015-01-04 LAB — CBC
HCT: 29.8 % — ABNORMAL LOW (ref 36.0–46.0)
HCT: 29.8 % — ABNORMAL LOW (ref 36.0–46.0)
Hemoglobin: 9.7 g/dL — ABNORMAL LOW (ref 12.0–15.0)
Hemoglobin: 9.7 g/dL — ABNORMAL LOW (ref 12.0–15.0)
MCH: 26.9 pg (ref 26.0–34.0)
MCH: 27 pg (ref 26.0–34.0)
MCHC: 32.6 g/dL (ref 30.0–36.0)
MCHC: 32.6 g/dL (ref 30.0–36.0)
MCV: 82.8 fL (ref 78.0–100.0)
MCV: 83 fL (ref 78.0–100.0)
Platelets: 180 10*3/uL (ref 150–400)
Platelets: 163 10*3/uL (ref 150–400)
RBC: 3.6 MIL/uL — ABNORMAL LOW (ref 3.87–5.11)
RBC: 3.59 MIL/uL — ABNORMAL LOW (ref 3.87–5.11)
RDW: 15 % (ref 11.5–15.5)
RDW: 15 % (ref 11.5–15.5)
WBC: 7.7 10*3/uL (ref 4.0–10.5)
WBC: 8.2 10*3/uL (ref 4.0–10.5)

## 2015-01-04 LAB — TYPE AND SCREEN
ABO/RH(D): A POS
ANTIBODY SCREEN: NEGATIVE

## 2015-01-04 LAB — RPR: RPR Ser Ql: NONREACTIVE

## 2015-01-04 LAB — ABO/RH: ABO/RH(D): A POS

## 2015-01-04 SURGERY — Surgical Case
Anesthesia: Regional

## 2015-01-04 MED ORDER — FENTANYL CITRATE (PF) 100 MCG/2ML IJ SOLN
100.0000 ug | INTRAMUSCULAR | Status: DC | PRN
  Administered 2015-01-04 (×2): 100 ug via INTRAVENOUS
  Filled 2015-01-04 (×2): qty 2

## 2015-01-04 MED ORDER — TERBUTALINE SULFATE 1 MG/ML IJ SOLN
0.2500 mg | Freq: Once | INTRAMUSCULAR | Status: DC | PRN
  Filled 2015-01-04: qty 1

## 2015-01-04 MED ORDER — OXYTOCIN 40 UNITS IN LACTATED RINGERS INFUSION - SIMPLE MED
62.5000 mL/h | INTRAVENOUS | Status: DC
  Administered 2015-01-04: 62.5 mL/h via INTRAVENOUS
  Filled 2015-01-04: qty 1000

## 2015-01-04 MED ORDER — ACETAMINOPHEN 325 MG PO TABS
650.0000 mg | ORAL_TABLET | ORAL | Status: DC | PRN
Start: 2015-01-04 — End: 2015-01-06

## 2015-01-04 MED ORDER — LIDOCAINE HCL (PF) 1 % IJ SOLN
30.0000 mL | INTRAMUSCULAR | Status: AC | PRN
  Administered 2015-01-04: 30 mL via SUBCUTANEOUS
  Filled 2015-01-04: qty 30

## 2015-01-04 MED ORDER — OXYCODONE-ACETAMINOPHEN 5-325 MG PO TABS: 2.0000 | ORAL_TABLET | ORAL | Status: DC | PRN

## 2015-01-04 MED ORDER — ONDANSETRON HCL 4 MG PO TABS
4.0000 mg | ORAL_TABLET | ORAL | Status: DC | PRN
Start: 2015-01-04 — End: 2015-01-06

## 2015-01-04 MED ORDER — MISOPROSTOL 200 MCG PO TABS
1000.0000 ug | ORAL_TABLET | Freq: Once | ORAL | Status: AC
  Administered 2015-01-04: 1000 ug via RECTAL

## 2015-01-04 MED ORDER — TETANUS-DIPHTH-ACELL PERTUSSIS 5-2.5-18.5 LF-MCG/0.5 IM SUSP
0.5000 mL | Freq: Once | INTRAMUSCULAR | Status: DC
Start: 2015-01-05 — End: 2015-01-06

## 2015-01-04 MED ORDER — MISOPROSTOL 25 MCG QUARTER TABLET
25.0000 ug | ORAL_TABLET | ORAL | Status: DC
  Administered 2015-01-04: 25 ug via VAGINAL
  Filled 2015-01-04 (×2): qty 0.25
  Filled 2015-01-04 (×5): qty 1

## 2015-01-04 MED ORDER — METHYLERGONOVINE MALEATE 0.2 MG/ML IJ SOLN
0.2000 mg | Freq: Once | INTRAMUSCULAR | Status: AC
  Administered 2015-01-04: 0.2 mg via INTRAMUSCULAR

## 2015-01-04 MED ORDER — DIBUCAINE 1 % RE OINT: 1.0000 "application " | TOPICAL_OINTMENT | RECTAL | Status: DC | PRN

## 2015-01-04 MED ORDER — ONDANSETRON HCL 4 MG/2ML IJ SOLN
4.0000 mg | INTRAMUSCULAR | Status: DC | PRN
Start: 2015-01-04 — End: 2015-01-06

## 2015-01-04 MED ORDER — METHYLERGONOVINE MALEATE 0.2 MG PO TABS
0.2000 mg | ORAL_TABLET | ORAL | Status: AC
  Administered 2015-01-04 – 2015-01-05 (×5): 0.2 mg via ORAL
  Filled 2015-01-04 (×5): qty 1

## 2015-01-04 MED ORDER — TERBUTALINE SULFATE 1 MG/ML IJ SOLN
0.2500 mg | Freq: Once | INTRAMUSCULAR | Status: AC
  Administered 2015-01-04: 0.25 mg via SUBCUTANEOUS

## 2015-01-04 MED ORDER — METHYLERGONOVINE MALEATE 0.2 MG/ML IJ SOLN
INTRAMUSCULAR | Status: AC
  Administered 2015-01-04: 0.2 mg via INTRAMUSCULAR
  Filled 2015-01-04: qty 1

## 2015-01-04 MED ORDER — SENNOSIDES-DOCUSATE SODIUM 8.6-50 MG PO TABS
2.0000 | ORAL_TABLET | ORAL | Status: DC
  Administered 2015-01-05 (×2): 2 via ORAL
  Filled 2015-01-04 (×2): qty 2

## 2015-01-04 MED ORDER — WITCH HAZEL-GLYCERIN EX PADS: 1.0000 "application " | MEDICATED_PAD | CUTANEOUS | Status: DC | PRN

## 2015-01-04 MED ORDER — ZOLPIDEM TARTRATE 5 MG PO TABS: 5.0000 mg | ORAL_TABLET | Freq: Every evening | ORAL | Status: DC | PRN

## 2015-01-04 MED ORDER — TERBUTALINE SULFATE 1 MG/ML IJ SOLN
INTRAMUSCULAR | Status: AC
Start: 2015-01-04 — End: 2015-01-04
  Filled 2015-01-04: qty 1

## 2015-01-04 MED ORDER — LANOLIN HYDROUS EX OINT: TOPICAL_OINTMENT | CUTANEOUS | Status: DC | PRN

## 2015-01-04 MED ORDER — METHYLERGONOVINE MALEATE 0.2 MG PO TABS: 0.2000 mg | ORAL_TABLET | ORAL | Status: DC | PRN

## 2015-01-04 MED ORDER — IBUPROFEN 600 MG PO TABS
600.0000 mg | ORAL_TABLET | Freq: Four times a day (QID) | ORAL | Status: DC
  Administered 2015-01-05 – 2015-01-06 (×7): 600 mg via ORAL
  Filled 2015-01-04 (×7): qty 1

## 2015-01-04 MED ORDER — FLEET ENEMA 7-19 GM/118ML RE ENEM: 1.0000 | ENEMA | RECTAL | Status: DC | PRN

## 2015-01-04 MED ORDER — OXYTOCIN BOLUS FROM INFUSION: 500.0000 mL | INTRAVENOUS | Status: DC

## 2015-01-04 MED ORDER — OXYCODONE-ACETAMINOPHEN 5-325 MG PO TABS: 1.0000 | ORAL_TABLET | ORAL | Status: DC | PRN

## 2015-01-04 MED ORDER — CITRIC ACID-SODIUM CITRATE 334-500 MG/5ML PO SOLN
30.0000 mL | ORAL | Status: DC | PRN
  Administered 2015-01-04: 30 mL via ORAL
  Filled 2015-01-04: qty 15

## 2015-01-04 MED ORDER — MISOPROSTOL 200 MCG PO TABS
ORAL_TABLET | ORAL | Status: AC
  Administered 2015-01-04: 1000 ug via RECTAL
  Filled 2015-01-04: qty 5

## 2015-01-04 MED ORDER — ACETAMINOPHEN 325 MG PO TABS: 650.0000 mg | ORAL_TABLET | ORAL | Status: DC | PRN

## 2015-01-04 MED ORDER — LACTATED RINGERS IV SOLN
INTRAVENOUS | Status: DC
  Administered 2015-01-04: 13:00:00 via INTRAVENOUS

## 2015-01-04 MED ORDER — DIPHENHYDRAMINE HCL 25 MG PO CAPS: 25.0000 mg | ORAL_CAPSULE | Freq: Four times a day (QID) | ORAL | Status: DC | PRN

## 2015-01-04 MED ORDER — LACTATED RINGERS IV SOLN
INTRAVENOUS | Status: DC
  Administered 2015-01-04: 01:00:00 via INTRAVENOUS

## 2015-01-04 MED ORDER — OXYTOCIN 40 UNITS IN LACTATED RINGERS INFUSION - SIMPLE MED
1.0000 m[IU]/min | INTRAVENOUS | Status: DC
  Administered 2015-01-04: 2 m[IU]/min via INTRAVENOUS

## 2015-01-04 MED ORDER — ONDANSETRON HCL 4 MG/2ML IJ SOLN: 4.0000 mg | Freq: Four times a day (QID) | INTRAMUSCULAR | Status: DC | PRN

## 2015-01-04 MED ORDER — LACTATED RINGERS IV SOLN
500.0000 mL | INTRAVENOUS | Status: DC | PRN
  Administered 2015-01-04: 500 mL via INTRAVENOUS

## 2015-01-04 MED ORDER — PRENATAL MULTIVITAMIN CH
1.0000 | ORAL_TABLET | Freq: Every day | ORAL | Status: DC
  Administered 2015-01-05 – 2015-01-06 (×2): 1 via ORAL
  Filled 2015-01-04 (×2): qty 1

## 2015-01-04 MED ORDER — BENZOCAINE-MENTHOL 20-0.5 % EX AERO
1.0000 | INHALATION_SPRAY | CUTANEOUS | Status: DC | PRN
Start: 2015-01-04 — End: 2015-01-06
  Administered 2015-01-04: 1 via TOPICAL
  Filled 2015-01-04: qty 56

## 2015-01-04 MED ORDER — METHYLERGONOVINE MALEATE 0.2 MG/ML IJ SOLN: 0.2000 mg | INTRAMUSCULAR | Status: DC | PRN

## 2015-01-04 MED ORDER — METHYLERGONOVINE MALEATE 0.2 MG/ML IJ SOLN: 0.2000 mg | INTRAMUSCULAR | Status: AC

## 2015-01-04 MED ORDER — SIMETHICONE 80 MG PO CHEW: 80.0000 mg | CHEWABLE_TABLET | ORAL | Status: DC | PRN

## 2015-01-04 NOTE — Progress Notes (Signed)
Dr. Adrian Blackwater and interpreter at bedside to explain version. Consent obtained and signed.

## 2015-01-04 NOTE — H&P (Signed)
Deserey Anoosha Boeh is a 34 y.o. female G3P2002 with IUP at 106w5d presenting for IOL 2/2 post dates. She presented in false labor, but was noted to be breech. She underwent an ECV by Dr. Adrian Blackwater.  [facility]are at [facility].  Prenatal History/Complications: none Past Medical History: Past Medical History  Diagnosis Date  . Medical history non-contributory   . 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 No past surgeries    . Tooth extraction      Obstetrical History: OB History    Gravida Para Term Preterm AB TAB SAB Ectopic Multiple Living   3 2 2  0 0 0 0 0 0 2       Social History: History   Social History  . Marital Status: Married    Spouse Name: N/A  . Number of Children: N/A  . Years of Education: N/A   Social History Main Topics  . Smoking status: Never Smoker   . Smokeless tobacco: Never Used  . Alcohol Use: No  . Drug Use: No  . Sexual Activity: Yes    Birth Control/ Protection: None   Other Topics Concern  . None   Social History Narrative    Family History: Family History  Problem Relation Age of Onset  . Hypertension Father   . Diabetes Other   . Anesthesia problems Neg Hx   . Other Neg Hx   . Cancer Maternal Grandfather     maternal    Allergies: No Known Allergies  Prescriptions prior to admission  Medication Sig Dispense Refill Last Dose  . Prenatal Vit-Fe Fumarate-FA (MULTIVITAMIN-PRENATAL) 27-0.8 MG TABS Take 1 tablet by mouth daily.     01/04/2015 at Unknown time  . norethindrone (ORTHO MICRONOR) 0.35 MG tablet Take 1 tablet (0.35 mg total) by mouth daily. Start 4 weeks after delivery. (Patient not taking: Reported on 01/04/2015) 1 Package 12 More than a month at Unknown time     Prenatal Transfer Tool  Maternal Diabetes: No Genetic  Screening: Normal Maternal Ultrasounds/Referrals: Normal Fetal Ultrasounds or other Referrals:  None Maternal Substance Abuse:  No Significant Maternal Medications:  None Significant Maternal Lab Results: None     Review of Systems   Constitutional: Negative for fever and chills Eyes: Negative for visual disturbances Respiratory: Negative for shortness of breath, dyspnea Cardiovascular: Negative for chest pain or palpitations  Gastrointestinal: Negative for vomiting, diarrhea and constipation.  POSITIVE for abdominal pain (contractions) Genitourinary: Negative for dysuria and urgency Musculoskeletal: Negative for back pain, joint pain, myalgias  Neurological: Negative for dizziness and headaches      Blood pressure 116/73, pulse 111, temperature 98.6 F (37 C), temperature source Oral, resp. rate 20, height 5\' 3"  (1.6 m), weight 87.544 kg (193 lb), SpO2 99 %, unknown if currently breastfeeding. General appearance: alert, cooperative and no distress Lungs: clear to auscultation bilaterally Heart: regular rate and rhythm Abdomen: soft, non-tender; bowel sounds normal Extremities: Homans sign is negative, no sign of DVT DTR's 2+ Presentation: cephalic Fetal monitoring  Baseline: 140 bpm, Variability: Good {> 6 bpm), Accelerations: Reactive and Decelerations: Absent Uterine activity   Occ, mild  Dilation: 1.5 Effacement (%): 60 Station: -2, -3 Exam by:: Lorretta Harp RNC    Prenatal labs: ABO, Rh: --/--/A POS, A POS (06/10 0050) Antibody: NEG (06/10  0050) Rubella:   RPR: Nonreactive (01/07 0000)  HBsAg: Negative (01/07 0000)  HIV: Non-reactive (01/07 0000)  GBS: Negative (05/12 0000)  3 hr Glucola:  72/150/162/105  Genetic screening  Normal AFP Anatomy US normal   Results for orders placed or performed during the hospital encounter of 01/03/15 (from the past 24 hour(s))  CBC   Collection Time: 01/04/15 12:50 AM  Result Value Ref Range   WBC 7.7 4.0 - 10.5 K/uL   RBC  3.60 (L) 3.87 - 5.11 MIL/uL   Hemoglobin 9.7 (L) 12.0 - 15.0 g/dL   HCT 69.6 (L) 29.5 - 28.4 %   MCV 82.8 78.0 - 100.0 fL   MCH 26.9 26.0 - 34.0 pg   MCHC 32.6 30.0 - 36.0 g/dL   RDW 13.2 44.0 - 10.2 %   Platelets 180 150 - 400 K/uL  Type and screen   Collection Time: 01/04/15 12:50 AM  Result Value Ref Range   ABO/RH(D) A POS    Antibody Screen NEG    Sample Expiration 01/07/2015   ABO/Rh   Collection Time: 01/04/15 12:50 AM  Result Value Ref Range   ABO/RH(D) A POS      Assessment: Earline Stiner is a 34 y.o. G3P2002 with an IUP at [redacted]w[redacted]d presenting for IOL 2/2 post dates, s/p ECV  Plan: #Labor: cytotec (avoid Foley d/t possible unstable lie) #Pain:  IV/epidural prn #FWB Cat 1 #ID: GBS: neg  #MOF:  Breast/bottleCRESENZO-DISHMAN,[place] Divis 01/04/2015, 7:42 AM

## 2015-01-04 NOTE — MAU Note (Addendum)
SAYS WITH INTERPRETER-  DEBBIE-   HURTING  BAD AT 8PM.  PNC  AT [facility].   VE AT [facility] ON Monday-   3-4 CM.  DENIES   HSV AND MRSA.  GBS- NEG  IS [facility] FOR AN INDUCTION ON  Sunday  0730

## 2015-01-04 NOTE — Progress Notes (Signed)
Patient ID: Robin Alvarado, female   DOB: 11/18/1980, 34 y.o.   MRN: [id number]  After informed verbal consent, Terbutaline 0.25 mg SQ given, ECV was attempted under Ultrasound guidance.  Baby turned in a counterclockwise fashion.  Baby had brief deceleration lasting 30 seconds following one of the attempts, but returned to baseline.   FHR was reactive before and after the procedure.   Pt. Tolerated the procedure well.  Levie Heritage, DO 01/04/2015, 6:43 AM

## 2015-01-04 NOTE — Progress Notes (Signed)
Patient ID: Robin Alvarado, female   DOB: 17-Apr-1981, 34 y.o.   MRN: [id number]  Labor Progress Note Patient seen with Spanish interpreter   S:  Resting comfortably. Feeling irregular contractions with mild to moderate pain.   O:  BP 124/74 mmHg  Pulse 98  Temp(Src) 99 F (37.2 C) (Oral)  Resp 20  Ht 5\' 3"  (1.6 m)  Wt 87.544 kg (193 lb)  BMI 34.20 kg/m2  SpO2 99% Cat I: baseline 145, accelerations present, no decelerations Toco: irregular contractions q7-10 minutes CVE: 3 cm, 70%, -1, fetal head palpated   A&P: 34 y.o. [id number] [redacted]w[redacted]d admitted for IOL due to postdates s/p ECV for breech presentation  # IOL. Progressing after cytotec x 1 with good cervical change. Bishop score now 11. Will start IV pitocin 2x2 and monitor for good contraction pattern # Analgesia: IV fentanyl q1 hour PRN. Patient does not want epidural  # FWB: Category I   Patient history, exam, assessment and plan discussed with Illene Bolus, CNM  Fabio Asa, MD 11:42 AM

## 2015-01-04 NOTE — MAU Provider Note (Signed)
First Provider Initiated Contact with Patient 01/04/15 [phone]      Chief Complaint:  Contractions at home, have since slowed down  Robin Alvarado is  34 y.o. G3P2002 at [redacted]w[redacted]d presents complaining of having several hours of contractions at home, but they have since slowed down.  Gets PNC at [facility], scheduled for IOL 2 days for post dates.   Obstetrical/Gynecological History: OB History    Gravida Para Term Preterm AB TAB SAB Ectopic Multiple Living   3 2 2  0 0 0 0 0 0 2     Past Medical History: Past Medical History  Diagnosis Date  . Medical history non-contributory   . 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 No past surgeries    . Tooth extraction      Family History: Family History  Problem Relation Age of Onset  . Hypertension Father   . Diabetes Other   . Anesthesia problems Neg Hx   . Other Neg Hx   . Cancer Maternal Grandfather     maternal    Social History: History  Substance Use Topics  . Smoking status: Never Smoker   . Smokeless tobacco: Never Used  . Alcohol Use: No    Allergies: No Known Allergies  Meds:  Prescriptions prior to admission  Medication Sig Dispense Refill Last Dose  . Prenatal Vit-Fe Fumarate-FA (MULTIVITAMIN-PRENATAL) 27-0.8 MG TABS Take 1 tablet by mouth daily.     01/04/2015 at Unknown time  . ibuprofen (ADVIL,MOTRIN) 600 MG tablet Take 1 tablet (600 mg total) by mouth every 6 (six) hours. 30 tablet 0 More than a month at Unknown time  . norethindrone (ORTHO MICRONOR) 0.35 MG tablet Take 1 tablet (0.35 mg total) by mouth daily. Start 4 weeks after delivery. 1 Package 12 More than a month at Unknown time    Review of Systems   Constitutional: Negative for fever and chills Eyes: Negative for visual  disturbances Respiratory: Negative for shortness of breath, dyspnea Cardiovascular: Negative for chest pain or palpitations  Gastrointestinal: Negative for vomiting, diarrhea and constipation Genitourinary: Negative for dysuria and urgency Musculoskeletal: Negative for back pain, joint pain, myalgias.  Normal ROM  Neurological: Negative for dizziness and headaches    Physical Exam  Blood pressure 137/70, pulse 71, temperature 98.4 F (36.9 C), temperature source Oral, resp. rate 20, unknown if currently breastfeeding. GENERAL: Well-developed, well-nourished female in no acute distress.  LUNGS: Clear to auscultation bilaterally.  HEART: Regular rate and rhythm. ABDOMEN: Soft, nontender, nondistended, gravid.  EXTREMITIES: Nontender, no edema, 2+ distal pulses. DTR's 2+ CERVICAL EXAM: Dilatation 1-2 cm   Effacement 60%   Station out of pelvis   Presentation: breech  FHT:  Baseline rate 150 bpm   Variability moderate  Accelerations present   Decelerations none Contractions: Every 10 mins   Labs: No results found for this or any previous visit (from the past 24 hour(s)). Imaging Studies:  No results found.  Assessment: Robin Alvarado is  34 y.o. G3P2002 at [redacted]w[redacted]d presents with false labor, breech.  Plan: Dr Adrian Blackwater plans ECV at 0730 d/t pt having eaten at 2100 tonight.   CRESENZO-DISHMAN,Duell Holdren 6/10/201612:54 AM

## 2015-01-04 NOTE — Progress Notes (Signed)
Monitors off for version

## 2015-01-05 LAB — CBC
HCT: 31 % — ABNORMAL LOW (ref 36.0–46.0)
Hemoglobin: 10 g/dL — ABNORMAL LOW (ref 12.0–15.0)
MCH: 26.7 pg (ref 26.0–34.0)
MCHC: 32.3 g/dL (ref 30.0–36.0)
MCV: 82.7 fL (ref 78.0–100.0)
PLATELETS: 176 10*3/uL (ref 150–400)
RBC: 3.75 MIL/uL — AB (ref 3.87–5.11)
RDW: 15 % (ref 11.5–15.5)
WBC: 10.9 10*3/uL — ABNORMAL HIGH (ref 4.0–10.5)

## 2015-01-05 NOTE — Progress Notes (Signed)
Interpreter used to collect feedings, explain plan of care and order breakfast for parent

## 2015-01-05 NOTE — Progress Notes (Signed)
Interpreter used for teaching and describing plan of care

## 2015-01-05 NOTE — Lactation Note (Signed)
This note was copied from the chart of Robin Naquana Veksler. Lactation Consultation Note Initial visit at 24 hours of age, with Spanish interpreter.    Mom reports last feeding about 1 hours ago, baby is asleep in crib.  Mom denies pain with latch and has experience with 2 older children for 8 months and then 9 months of breastfeeding. [facility] LC resources given and discussed.  Encouraged to feed with early cues on demand.  Early newborn behavior discussed.  Hand expression demonstrated for several minutes  With small drop of colostrum visible, encouraged mom to attempt prior to feedings and hand pump was given.  Mom is familiar with same hand pump.    Mom to call for assist as needed.     Patient Name: Robin Alvarado Today's Date: 01/05/2015 Reason for consult: Initial assessment   Maternal Data Has patient been taught Hand Expression?: Yes Does the patient have breastfeeding experience prior to this delivery?: Yes  Feeding    LATCH Score/Interventions                      Lactation Tools Discussed/Used WIC Program: Yes Initiated by:: JS Date initiated:: 01/05/15   Consult Status Consult Status: Follow-up Date: 01/06/15 Follow-up type: In-patient    Akiel Fennell, Arvella Merles 01/05/2015, 6:13 PM

## 2015-01-05 NOTE — Progress Notes (Signed)
Checked on patient. Ordered patient dinner [employer] officer, [employer]

## 2015-01-05 NOTE — Progress Notes (Signed)
Interpreter used for teaching

## 2015-01-05 NOTE — Progress Notes (Signed)
Post Partum Day 1 Subjective: no complaints  Objective: Blood pressure 111/45, pulse 67, temperature 97.7 F (36.5 C), temperature source Axillary, resp. rate 20, height 5\' 3"  (1.6 m), weight 87.544 kg (193 lb), SpO2 98 %, unknown if currently breastfeeding.  Physical Exam:  General: alert and cooperative Lochia: appropriate Uterine Fundus: firm Incision:  DVT Evaluation: No evidence of DVT seen on physical exam.   Recent Labs  01/04/15 1826 01/05/15 0526  HGB 9.7* 10.0*  HCT 29.8* 31.0*    Assessment/Plan: Plan for discharge tomorrow, Breastfeeding and Contraception OCP   LOS: 1 day   Clemmons,Lori Grissett 01/05/2015, 1:54 PM

## 2015-01-05 NOTE — Progress Notes (Signed)
Assisted RN lactation with interpretation of breastfeeding instructions. Also, assisted RN with interpretation of baby exam. Spanish Interpreter

## 2015-01-05 NOTE — Progress Notes (Signed)
Checked on patient ordered patient late snack and breakfast.  Spanish Interpreter

## 2015-01-06 ENCOUNTER — Inpatient Hospital Stay (HOSPITAL_COMMUNITY): Admission: RE | Admit: 2015-01-06 | Payer: Medicaid Other | Source: Ambulatory Visit | Admitting: Family Medicine

## 2015-01-06 MED ORDER — SENNOSIDES-DOCUSATE SODIUM 8.6-50 MG PO TABS: 1.0000 | ORAL_TABLET | ORAL | Status: DC | PRN

## 2015-01-06 MED ORDER — IBUPROFEN 600 MG PO TABS: 600.0000 mg | ORAL_TABLET | Freq: Four times a day (QID) | ORAL | Status: DC

## 2015-01-06 NOTE — Discharge Instructions (Signed)

## 2015-01-06 NOTE — Discharge Summary (Signed)
Obstetric Discharge Summary Reason for Admission: onset of labor Prenatal Procedures: ultrasound Intrapartum Procedures: spontaneous vaginal delivery Postpartum Procedures: PPH Complications-Operative and Postpartum: hemorrhage HEMOGLOBIN  Date Value Ref Range Status  01/05/2015 10.0* 12.0 - 15.0 g/dL Final   HCT  Date Value Ref Range Status  01/05/2015 31.0* 36.0 - 46.0 % Final    Physical Exam:  General: alert, cooperative, appears stated age and no distress Lochia: appropriate Uterine Fundus: firm Incision: n/a DVT Evaluation: No evidence of DVT seen on physical exam. Negative Homan's sign. No cords or calf tenderness. No significant calf/ankle edema.  Discharge Diagnoses: Term Pregnancy-delivered  Discharge Information: Date: 01/06/2015 Activity: pelvic rest Diet: routine Medications: None, Ibuprofen and Percocet Condition: stable and improved Instructions: refer to practice specific booklet Discharge to: home Follow-up Information    Schedule an appointment as soon as possible for a visit with [facility] [place].   Why:  for post-partum visit   Contact information:   [address] [place] [address] [phone]      Newborn Data: Live born female  Birth Weight: 8 lb 9.9 oz (3910 g) APGAR: 7, 9  Home with mother.  Robin Alvarado 01/06/2015, 8:48 AM

## 2015-01-06 NOTE — Discharge Summary (Signed)
Obstetric Discharge Summary Reason for Admission: induction of labor 2/2 post-dates Prenatal Procedures: NST and ultrasound Intrapartum Procedures: spontaneous vaginal delivery Postpartum Procedures: none Complications-Operative and Postpartum: hemorrhage; PPH EBL ~800  Delivery Summary: Patient is 34 y.o. [id number] [redacted]w[redacted]d admitted for induction of labor due to postdates. At presentation, she was found to be in breech presentation and underwent successful ECV/  At 5:29 PM a viable female was delivered via Vaginal, Spontaneous Delivery (Presentation:OA). APGAR: 7, 9; weight pending. Placenta status: Intact, Spontaneous. Cord: 3 vessels with the following complications: anterior shoulder dystocia x 1 minute  Patient was noted to be complete and +2 station. She delivered healthy female infant over intact perineum via OA presentation. Anterior shoulder dystocia x 1 minute was reduced with McRobert's maneuver, suprapubic pressure and Woodscrew maneuver. Nuchal cord x 1; infant delivered through with summersault maneuver. Remainder of infant was extracted from vagina without difficulty. Delayed cord clamping was performed after approximately 2 minutes. Uterus massaged and noted to be firm but with persistent bleeding. Uterus massaged again and noted to be boggy. Bimanual exam with removal of approximately 10 cc of clot from cervical os. Uterus still intermittently boggy. Cytotec 1000 mcg PR was given. Uterus massaged and noted to be firm with decreased bleeding. Perineal laceration with bleeding was repaired with 3.0 vicryl with good hemostasis at laceration site. Uterus massaged and again noted to be intermittently boggy with active bleeding. Bimanual exam with 20 cc of clot removed from cervical os; bimanual massage with uterus firm but boggy when massage halted. Patient given IM methergine. Uterus massaged and firmer with significantly decreased bleeding. Patient will continue on methergine series x 24  hours postpartum.   Anesthesia: Local  Episiotomy: None Lacerations: 1st degree;Perineal Suture Repair: 3.0 vicryl Est. Blood Loss (mL): 800 mL  Hospital Course:  Active Problems:   Breech birth   Pregnancy   Robin Alvarado is a 34 y.o. [phone] s/p SVD.  Patient was admitted for IOl 2/2 post-dates.  She has postpartum course that was complicated by PPH. She received cytotec 1000 PR and methergine series x24hr. Hbg remained stable. The pt feels ready to go home and  will be discharged with outpatient follow-up.   Today: No acute events overnight.  Pt denies problems with ambulating, voiding or po intake.  She denies nausea or vomiting.  Pain is well controlled.  She has had flatus. She has had bowel movement.  Lochia Small.  Plan for birth control is  oral progesterone-only contraceptive.  Method of Feeding: Breast.  Physical Exam:  General: alert, cooperative and no distress Lochia: appropriate Uterine Fundus: firm DVT Evaluation: No evidence of DVT seen on physical exam.  H/H: Lab Results  Component Value Date/Time   HGB 10.0* 01/05/2015 05:26 AM   HCT 31.0* 01/05/2015 05:26 AM    Discharge Diagnoses: Term Pregnancy-delivered  Discharge Information: Date: 01/06/2015 Activity: pelvic rest Diet: routine  Medications: PNV, Ibuprofen and Colace Breast feeding:  Yes Condition: stable Instructions: refer to handout Discharge to: home       Discharge Instructions    Increase activity slowly    Complete by:  As directed             Medication List    TAKE these medications        ibuprofen 600 MG tablet  Commonly known as:  ADVIL,MOTRIN  Take 1 tablet (600 mg total) by mouth every 6 (six) hours.     multivitamin-prenatal 27-0.8 MG Tabs tablet  Take 1 tablet  by mouth daily.     norethindrone 0.35 MG tablet  Commonly known as:  ORTHO MICRONOR  Take 1 tablet (0.35 mg total) by mouth daily. Start 4 weeks after delivery.     senna-docusate 8.6-50  MG per tablet  Commonly known as:  Senokot-S  Take 1 tablet by mouth as needed for mild constipation.       Follow-up Information    Schedule an appointment as soon as possible for a visit with [facility] DE[facility] [place].   Why:  for post-partum visit   Contact information:   [address] [facility] [address] [phone]      Caryl Ada, DO 01/06/2015, 8:35 AM PGY-1, [facility]

## 2015-01-06 NOTE — Lactation Note (Signed)
This note was copied from the chart of Robin Devory Dornbusch. Lactation Consultation Note  Patient Name: Robin Alvarado [id number] Date: 01/06/2015 Reason for consult: Follow-up assessment   With this mom ofa ter baby, now 10 hours old. Baby is jaunduiced, and acting very hunry. Mom desribes cluster feeding, but Lenis Dickinson the baby acting like he was not satiated, crying. The baby was fed 15 mls of formula by bottle, and then brest fed for a few minutes, and then was clam, and went to sleep.  Mom is an experienced breast feeder, and said( with the help of spanish interpreter), that she had a good milk supply with her first 2 children. On exam, mom has very wide spaced breast, and her breast point out laterally. She has soft breasts with good nipples. She does not feel full, and I was able to hand express just a tiny glimmer of clear colostrum from her right breast, but a large drop of transitional while milk from her left.  On exam of baby's mouth, she has what appears to be a tongue that goes ahlf way up to her palate with elevation, and a short popsterior frenulum. The baby would not suck on my finger, sicnc she was just fed, but she was able to easily extend her tongu over her gum line. My concern would be that her sucking pattern may inhibit her ability to transfer well, but mom denies andy discomfort with latch, and mom's milk is not transitioned  in yet.  Mom is going to continue breast feeding with cues, and offering a botttle of formula every 3 hours, after breast feeding.   Maternal Data    Feeding Feeding Type: Breast Fed Length of feed: 20 min  LATCH Score/Interventions Latch: Grasps breast easily, tongue down, lips flanged, rhythmical sucking. Intervention(s): Skin to skin  Audible Swallowing: A few with stimulation (drops seen with hand expression) Intervention(s): Skin to skin  Type of Nipple: Everted at rest and after stimulation  Comfort  (Breast/Nipple): Soft / non-tender     Hold (Positioning): No assistance needed to correctly position infant at breast.  LATCH Score: 9  Lactation Tools Discussed/Used     Consult Status Consult Status: Follow-up Date: 01/06/15 Follow-up type: In-patient    Alfred Levins 01/06/2015, 11:50 AM

## 2015-01-07 NOTE — Progress Notes (Signed)
Post discharge chart review completed.  

## 2015-08-15 ENCOUNTER — Other Ambulatory Visit: Payer: Self-pay | Admitting: Pediatrics

## 2015-08-15 MED ORDER — PERMETHRIN 5 % EX CREA: 1.0000 "application " | TOPICAL_CREAM | Freq: Once | CUTANEOUS | Status: DC

## 2015-08-15 NOTE — Progress Notes (Signed)
Treating children for scabies Robin Peru, MD

## 2017-03-25 LAB — OB RESULTS CONSOLE ABO/RH: RH Type: POSITIVE

## 2017-03-25 LAB — OB RESULTS CONSOLE RUBELLA ANTIBODY, IGM: Rubella: IMMUNE

## 2017-03-25 LAB — OB RESULTS CONSOLE GC/CHLAMYDIA
Chlamydia: NEGATIVE
Gonorrhea: NEGATIVE

## 2017-03-25 LAB — OB RESULTS CONSOLE ANTIBODY SCREEN: Antibody Screen: NEGATIVE

## 2017-03-25 LAB — OB RESULTS CONSOLE HIV ANTIBODY (ROUTINE TESTING): HIV: NONREACTIVE

## 2017-03-25 LAB — OB RESULTS CONSOLE RPR: RPR: NONREACTIVE

## 2017-03-25 LAB — OB RESULTS CONSOLE HEPATITIS B SURFACE ANTIGEN: Hepatitis B Surface Ag: NEGATIVE

## 2017-03-26 ENCOUNTER — Other Ambulatory Visit (HOSPITAL_COMMUNITY): Payer: Self-pay | Admitting: Nurse Practitioner

## 2017-03-26 DIAGNOSIS — O09522 Supervision of elderly multigravida, second trimester: Secondary | ICD-10-CM

## 2017-04-02 ENCOUNTER — Encounter (HOSPITAL_COMMUNITY): Payer: Self-pay | Admitting: *Deleted

## 2017-04-06 ENCOUNTER — Ambulatory Visit (HOSPITAL_COMMUNITY)
Admission: RE | Admit: 2017-04-06 | Discharge: 2017-04-06 | Disposition: A | Discharge status: Home / Self Care | Payer: Self-pay | Source: Ambulatory Visit | Attending: Nurse Practitioner | Admitting: Nurse Practitioner

## 2017-04-06 ENCOUNTER — Other Ambulatory Visit (HOSPITAL_COMMUNITY): Payer: Self-pay | Admitting: *Deleted

## 2017-04-06 ENCOUNTER — Encounter (HOSPITAL_COMMUNITY): Payer: Self-pay

## 2017-04-06 DIAGNOSIS — Z0489 Encounter for examination and observation for other specified reasons: Secondary | ICD-10-CM

## 2017-04-06 DIAGNOSIS — IMO0002 Reserved for concepts with insufficient information to code with codable children: Secondary | ICD-10-CM

## 2017-04-06 DIAGNOSIS — O09522 Supervision of elderly multigravida, second trimester: Secondary | ICD-10-CM | POA: Insufficient documentation

## 2017-04-06 DIAGNOSIS — Z3A19 19 weeks gestation of pregnancy: Secondary | ICD-10-CM

## 2017-04-06 DIAGNOSIS — O352XX1 Maternal care for (suspected) hereditary disease in fetus, fetus 1: Secondary | ICD-10-CM

## 2017-04-06 DIAGNOSIS — Z3689 Encounter for other specified antenatal screening: Secondary | ICD-10-CM | POA: Insufficient documentation

## 2017-04-06 NOTE — Progress Notes (Signed)
Genetic Counseling  High-Risk Gestation Note  Appointment Date:  04/06/2017 Referred By: Einar Grad, NP Date of Birth:  22-Oct-1980  Pregnancy History: [id number] Estimated Date of Delivery: 08/27/17 Estimated Gestational Age: [redacted]w[redacted]d Attending: Darlyn Read, MD  Ms. Maurita Havener was seen for genetic counseling because of a maternal age of 36. Viviana Simpler, [employer] interpreter, translated during the genetic counseling appointment today.     In summary:  Discussed AMA and associated risk for fetal aneuploidy  Reviewed Quad screen results and associated reduction in risks for fetal DS and T18  Discussed options for screening  NIPS-declined  Ultrasound-performed today; heart views limited; f/u ultrasound in 4 weeks; no anomalies or markers for aneuploidy visualized  Discussed diagnostic testing options  Amniocentesis-declined  Reviewed family history concerns  2 nieces with eye concerns (micropthalmia and congenital blindness)  Cause unknown  Reviewed various etiologies and possible recessive inheritance  Discussed carrier screening options  CF, SMA, and hemoglobinopathies-declined  She was counseled regarding maternal age and the association with risk for chromosome conditions due to nondisjunction with aging of the ova.  We reviewed chromosomes, nondisjunction, and the associated 1 in 100 risk for fetal aneuploidy related to a maternal age of 36 y.o. at [redacted]w[redacted]d gestation. She was counseled that the risk for aneuploidy decreases as gestational age increases, accounting for those pregnancies which spontaneously abort. We specifically discussed Down syndrome (trisomy 36), trisomies 4 and 57, and sex chromosome aneuploidies (36,XXX and 36,XXY) including the common features and prognoses of each.   We also reviewed Ms. Wayne Sever Rodriguez's maternal serum Quad screen result and the associated reduction in risks for fetal Down syndrome (1 in 3,245), trisomy 18 (1 in 10,000), and  ONTDs.  She understands that Quad screening provides a pregnancy specific risk for Down syndrome, but is not considered to be diagnostic.    We reviewed other available screening options including noninvasive prenatal screening (NIPS)/cell free DNA (cfDNA) screening and detailed ultrasound.  She was counseled that screening tests are used to modify a patient's a priori risk for aneuploidy, typically based on age. This estimate provides a pregnancy specific risk assessment. We reviewed the benefits and limitations of each option. Specifically, we discussed the conditions for which each test screens, the detection rates, and false positive rates of each. She was also counseled regarding diagnostic testing via amniocentesis. We reviewed the approximate 1 in 300-500 risk for complications from amniocentesis, including spontaneous pregnancy loss. We discussed the possible results that the tests might provide including: positive, negative, unanticipated, and no result. Finally, she was counseled regarding the cost of each option and potential out of pocket expenses. After consideration of all the options, she elected to have a detailed ultrasound.  A complete ultrasound was performed today. The ultrasound report will be documented separately. There were no visualized fetal anomalies or markers suggestive of aneuploidy; however, the fetal heart views were limited. Follow-up ultrasound is scheduled in 4 weeks. Amniocentesis and NIPS were declined today. She understands that screening tests cannot rule out all birth defects or genetic syndromes. The patient was advised of this limitation and states she still does not want additional testing at this time.   Mrs. Jaleeah Slight was provided with written information regarding cystic fibrosis (CF), spinal muscular atrophy (SMA) and hemoglobinopathies including the carrier frequency, availability of carrier screening and prenatal diagnosis if indicated.  In addition, we  discussed that CF and hemoglobinopathies are routinely screened for as part of the [place] newborn screening panel.  After further  discussion, she declined screening for CF, SMA and hemoglobinopathies.  Both family histories were reviewed and found to be contributory for Mrs. Tomasa HoseHerrera Rodriguez having a brother who had two daughters with congenital eye differences. The oldest daughter, who is 36 years old, has left-sided micropthalmia (images of niece were reviewed). She has normal vision in the right eye, but reduced vision in the left. She has no other congenital anomalies, health problems, or developmental concerns. The youngest daughter, who is 36 years old, has bilateral congenital blindness of unknown etiology.  This niece also has an otherwise noncontributory developmental and medical history. Unfortunately, Ms. Wayne SeverHerrera Rodriguez's brother died during his wife's pregnancy with the younger child and the relationship between the patient and her sister-in-law is strained. For this reason, there is limited information regarding these relatives and their conditions. Mrs. Tomasa HoseHerrera Rodriguez does not think that her nieces have been evaluated by a medical geneticist and is unaware of any genetic testing or results for her nieces.   We discussed that micropthalmia is an eye difference characterized by one or both eyes being smaller than usual. Micropthalmia may or may not result in significant vision loss and may be associated with other eye conditions such as a coloboma, cataracts, and microcornea. We discussed that between one-third and one-half of individuals with micropthalmia have a specific underlying genetic condition, which may cause other non-vision related medical or developmental concerns. The remaining 50+ percent of individuals have isolated or nonsyndromic micropthalmia.  We reviewed that microphthalmia may be caused by changes in many genes involved in the early development of the eye, most of which have  not been identified. The condition may also result from a chromosomal abnormality affecting one or more genes.  Most genetic changes associated with isolated microphthalmia have been identified only in very small numbers of affected individuals. There are a variety of genetic syndromes in which micropthalmia is a primary feature. We discussed that microphthalmia may also be caused by environmental factors that affect early development, such as a shortage of certain vitamins during pregnancy, radiation, infections such as rubella, or exposure to teratogens.    We also discussed that congenital blindness can be environmental, multifactorial or genetic in etiology. We reviewed autosomal recessive inheritance, given the reported family history. We reviewed that in this scenario, the patient would have up to a 1/2 chance to be a carrier of the same condition; however, the risk of blindness/micropthalmia would be expected to remain low for the fetus, given denied consanguinity. The remainder of both family histories were noncontributory for birth defects, intellectual disability, and known genetic conditions. Without further information regarding the provided family history, an accurate genetic risk cannot be calculated. Further genetic counseling is warranted if more information is obtained.  Ms. Tomasa HoseHerrera Rodriguez denied exposure to environmental toxins or chemical agents. She denied the use of alcohol, tobacco or street drugs. She denied significant viral illnesses during the course of her pregnancy. Her medical and surgical histories were noncontributory.   I counseled this patient regarding the above risks and available options.  The approximate face-to-face time with the genetic counselor was 42 minutes.  Donald Prosehristy S. Tamika Nou, MS Certified Genetic Counselor

## 2017-04-08 ENCOUNTER — Other Ambulatory Visit: Payer: Self-pay

## 2017-05-04 ENCOUNTER — Ambulatory Visit (HOSPITAL_COMMUNITY)
Admission: RE | Admit: 2017-05-04 | Discharge: 2017-05-04 | Disposition: A | Discharge status: Home / Self Care | Payer: Self-pay | Source: Ambulatory Visit | Attending: Nurse Practitioner | Admitting: Nurse Practitioner

## 2017-05-04 ENCOUNTER — Other Ambulatory Visit (HOSPITAL_COMMUNITY): Payer: Self-pay | Admitting: Maternal & Fetal Medicine

## 2017-05-04 ENCOUNTER — Encounter (HOSPITAL_COMMUNITY): Payer: Self-pay

## 2017-05-04 DIAGNOSIS — IMO0002 Reserved for concepts with insufficient information to code with codable children: Secondary | ICD-10-CM

## 2017-05-04 DIAGNOSIS — O09522 Supervision of elderly multigravida, second trimester: Secondary | ICD-10-CM

## 2017-05-04 DIAGNOSIS — Z0489 Encounter for examination and observation for other specified reasons: Secondary | ICD-10-CM

## 2017-05-04 DIAGNOSIS — Z362 Encounter for other antenatal screening follow-up: Secondary | ICD-10-CM | POA: Insufficient documentation

## 2017-05-04 DIAGNOSIS — Z3A23 23 weeks gestation of pregnancy: Secondary | ICD-10-CM | POA: Insufficient documentation

## 2017-07-26 LAB — OB RESULTS CONSOLE GC/CHLAMYDIA
CHLAMYDIA, DNA PROBE: NEGATIVE
Gonorrhea: NEGATIVE

## 2017-07-26 LAB — OB RESULTS CONSOLE GBS: GBS: NEGATIVE

## 2017-07-27 NOTE — L&D Delivery Note (Signed)
Delivery Note At 7:49 PM a viable female was delivered via Vaginal, Spontaneous (Presentation: vertex; ROA) over an intact perineum. Head delivered ROA. No nuchal cord present. Shoulder and body delivered in usual fashion. Infant placed on mother's abdomen, dried and bulb suctioned. Cord clamped x 2 after 1-minute delay, and cut by family member. Cord blood drawn. After 1 minute, the cord was clamped and cut. 40 units of pitocin diluted in 1000cc LR was infused rapidly IV.  The placenta separated spontaneously and delivered via CCT and maternal pushing effort.  It was inspected and appears to be intact with a 3 VC.    APGAR: 8,9; weight pending.   Placenta status: intact, to L&D.  Cord: 3V  without complications.  Cord pH: not sent  Anesthesia: n/a  Episiotomy: None Lacerations: 1st degree Suture Repair: n/a Est. Blood Loss (mL): 600  Aggressive fundal massage, rectal ctytotec 800, lysteda 1000mg  IV given with abatement of bleeding.  Mom to postpartum.  Baby to Couplet care / Skin to Skin.  Alroy BailiffParker W Halvor Behrend 08/20/2017, 8:21 PM

## 2017-07-30 ENCOUNTER — Encounter: Payer: Self-pay | Admitting: Obstetrics and Gynecology

## 2017-07-30 DIAGNOSIS — O09293 Supervision of pregnancy with other poor reproductive or obstetric history, third trimester: Secondary | ICD-10-CM

## 2017-07-30 HISTORY — DX: Supervision of pregnancy with other poor reproductive or obstetric history, third trimester: O09.293

## 2017-08-13 ENCOUNTER — Inpatient Hospital Stay (HOSPITAL_COMMUNITY)
Admission: AD | Admit: 2017-08-13 | Discharge: 2017-08-13 | Disposition: A | Discharge status: Home / Self Care | Payer: Self-pay | Source: Ambulatory Visit | Attending: Obstetrics & Gynecology | Admitting: Obstetrics & Gynecology

## 2017-08-13 ENCOUNTER — Encounter (HOSPITAL_COMMUNITY): Payer: Self-pay | Admitting: *Deleted

## 2017-08-13 DIAGNOSIS — Z3A38 38 weeks gestation of pregnancy: Secondary | ICD-10-CM | POA: Insufficient documentation

## 2017-08-13 DIAGNOSIS — O479 False labor, unspecified: Secondary | ICD-10-CM

## 2017-08-13 DIAGNOSIS — O26893 Other specified pregnancy related conditions, third trimester: Secondary | ICD-10-CM | POA: Insufficient documentation

## 2017-08-13 DIAGNOSIS — N898 Other specified noninflammatory disorders of vagina: Secondary | ICD-10-CM | POA: Insufficient documentation

## 2017-08-13 LAB — POCT FERN TEST: POCT Fern Test: NEGATIVE

## 2017-08-13 LAB — AMNISURE RUPTURE OF MEMBRANE (ROM) NOT AT ARMC: AMNISURE: NEGATIVE

## 2017-08-13 NOTE — Discharge Instructions (Signed)

## 2017-08-13 NOTE — MAU Note (Signed)
Pt presents to MAU c/o SROM that occurred at 0000. Pt reports good FM. Pt denies bleeding. Pt states they did a US at the office on 08/12/17 and it was to determine if the baby would be born vaginal or via c-section pt was supposed to get a call today for result, however they stated if the baby is over 9lbs she would have a c-section.

## 2017-08-17 ENCOUNTER — Encounter (HOSPITAL_COMMUNITY): Payer: Self-pay | Admitting: *Deleted

## 2017-08-17 ENCOUNTER — Telehealth (HOSPITAL_COMMUNITY): Payer: Self-pay | Admitting: *Deleted

## 2017-08-17 NOTE — Telephone Encounter (Signed)
Preadmission screen Interpreter number [id number] 

## 2017-08-18 ENCOUNTER — Other Ambulatory Visit: Payer: Self-pay | Admitting: Advanced Practice Midwife

## 2017-08-20 ENCOUNTER — Other Ambulatory Visit: Payer: Self-pay

## 2017-08-20 ENCOUNTER — Encounter (HOSPITAL_COMMUNITY): Payer: Self-pay

## 2017-08-20 ENCOUNTER — Inpatient Hospital Stay (HOSPITAL_COMMUNITY)
Admission: RE | Admit: 2017-08-20 | Discharge: 2017-08-22 | DRG: 807 | Disposition: A | Discharge status: Home / Self Care | Payer: Medicaid Other | Source: Ambulatory Visit | Attending: Obstetrics and Gynecology | Admitting: Obstetrics and Gynecology

## 2017-08-20 DIAGNOSIS — O99214 Obesity complicating childbirth: Secondary | ICD-10-CM | POA: Diagnosis present

## 2017-08-20 DIAGNOSIS — O26893 Other specified pregnancy related conditions, third trimester: Principal | ICD-10-CM | POA: Diagnosis present

## 2017-08-20 DIAGNOSIS — O09522 Supervision of elderly multigravida, second trimester: Secondary | ICD-10-CM | POA: Diagnosis present

## 2017-08-20 DIAGNOSIS — Z8759 Personal history of other complications of pregnancy, childbirth and the puerperium: Secondary | ICD-10-CM

## 2017-08-20 DIAGNOSIS — E669 Obesity, unspecified: Secondary | ICD-10-CM | POA: Diagnosis present

## 2017-08-20 DIAGNOSIS — Z3490 Encounter for supervision of normal pregnancy, unspecified, unspecified trimester: Secondary | ICD-10-CM

## 2017-08-20 DIAGNOSIS — Z3A39 39 weeks gestation of pregnancy: Secondary | ICD-10-CM

## 2017-08-20 DIAGNOSIS — O09293 Supervision of pregnancy with other poor reproductive or obstetric history, third trimester: Secondary | ICD-10-CM

## 2017-08-20 DIAGNOSIS — O139 Gestational [pregnancy-induced] hypertension without significant proteinuria, unspecified trimester: Secondary | ICD-10-CM

## 2017-08-20 DIAGNOSIS — O134 Gestational [pregnancy-induced] hypertension without significant proteinuria, complicating childbirth: Secondary | ICD-10-CM | POA: Diagnosis present

## 2017-08-20 HISTORY — DX: Personal history of other complications of pregnancy, childbirth and the puerperium: Z87.59

## 2017-08-20 LAB — CBC
HEMATOCRIT: 38.6 % (ref 36.0–46.0)
Hemoglobin: 12.7 g/dL (ref 12.0–15.0)
MCH: 29.1 pg (ref 26.0–34.0)
MCHC: 32.9 g/dL (ref 30.0–36.0)
MCV: 88.5 fL (ref 78.0–100.0)
PLATELETS: 261 10*3/uL (ref 150–400)
RBC: 4.36 MIL/uL (ref 3.87–5.11)
RDW: 14.6 % (ref 11.5–15.5)
WBC: 5.5 10*3/uL (ref 4.0–10.5)

## 2017-08-20 LAB — COMPREHENSIVE METABOLIC PANEL
ALBUMIN: 2.7 g/dL — AB (ref 3.5–5.0)
ALK PHOS: 212 U/L — AB (ref 38–126)
ALT: 14 U/L (ref 14–54)
AST: 25 U/L (ref 15–41)
Anion gap: 11 (ref 5–15)
BUN: 8 mg/dL (ref 6–20)
CALCIUM: 8.9 mg/dL (ref 8.9–10.3)
CO2: 20 mmol/L — ABNORMAL LOW (ref 22–32)
Chloride: 104 mmol/L (ref 101–111)
Creatinine, Ser: 0.59 mg/dL (ref 0.44–1.00)
GFR calc Af Amer: >60 mL/min (ref >60)
GLUCOSE: 145 mg/dL — AB (ref 65–99)
Potassium: 4 mmol/L (ref 3.5–5.1)
Sodium: 135 mmol/L (ref 135–145)
Total Bilirubin: 0.3 mg/dL (ref 0.3–1.2)
Total Protein: 6.9 g/dL (ref 6.5–8.1)

## 2017-08-20 LAB — TYPE AND SCREEN
ABO/RH(D): A POS
Antibody Screen: NEGATIVE

## 2017-08-20 LAB — RPR: RPR Ser Ql: NONREACTIVE

## 2017-08-20 LAB — PROTEIN / CREATININE RATIO, URINE
CREATININE, URINE: 20 mg/dL
Total Protein, Urine: <6 mg/dL

## 2017-08-20 MED ORDER — LACTATED RINGERS IV SOLN
INTRAVENOUS | Status: DC
  Administered 2017-08-20 (×3): via INTRAVENOUS

## 2017-08-20 MED ORDER — SIMETHICONE 80 MG PO CHEW: 80.0000 mg | CHEWABLE_TABLET | ORAL | Status: DC | PRN

## 2017-08-20 MED ORDER — WITCH HAZEL-GLYCERIN EX PADS: 1.0000 "application " | MEDICATED_PAD | CUTANEOUS | Status: DC | PRN

## 2017-08-20 MED ORDER — ONDANSETRON HCL 4 MG/2ML IJ SOLN: 4.0000 mg | Freq: Four times a day (QID) | INTRAMUSCULAR | Status: DC | PRN

## 2017-08-20 MED ORDER — OXYTOCIN 40 UNITS IN LACTATED RINGERS INFUSION - SIMPLE MED
1.0000 m[IU]/min | INTRAVENOUS | Status: DC
  Administered 2017-08-20: 2 m[IU]/min via INTRAVENOUS

## 2017-08-20 MED ORDER — PRENATAL MULTIVITAMIN CH
1.0000 | ORAL_TABLET | Freq: Every day | ORAL | Status: DC
  Administered 2017-08-21 – 2017-08-22 (×2): 1 via ORAL
  Filled 2017-08-20 (×2): qty 1

## 2017-08-20 MED ORDER — OXYTOCIN 40 UNITS IN LACTATED RINGERS INFUSION - SIMPLE MED
2.5000 [IU]/h | INTRAVENOUS | Status: DC
  Administered 2017-08-20: 2.5 [IU]/h via INTRAVENOUS
  Filled 2017-08-20: qty 1000

## 2017-08-20 MED ORDER — DIPHENHYDRAMINE HCL 25 MG PO CAPS: 25.0000 mg | ORAL_CAPSULE | Freq: Four times a day (QID) | ORAL | Status: DC | PRN

## 2017-08-20 MED ORDER — MISOPROSTOL 50MCG HALF TABLET: 50.0000 ug | ORAL_TABLET | ORAL | Status: DC | PRN

## 2017-08-20 MED ORDER — OXYCODONE-ACETAMINOPHEN 5-325 MG PO TABS: 2.0000 | ORAL_TABLET | ORAL | Status: DC | PRN

## 2017-08-20 MED ORDER — ACETAMINOPHEN 325 MG PO TABS: 650.0000 mg | ORAL_TABLET | ORAL | Status: DC | PRN

## 2017-08-20 MED ORDER — SOD CITRATE-CITRIC ACID 500-334 MG/5ML PO SOLN: 30.0000 mL | ORAL | Status: DC | PRN

## 2017-08-20 MED ORDER — DIBUCAINE 1 % RE OINT: 1.0000 "application " | TOPICAL_OINTMENT | RECTAL | Status: DC | PRN

## 2017-08-20 MED ORDER — ONDANSETRON HCL 4 MG/2ML IJ SOLN: 4.0000 mg | INTRAMUSCULAR | Status: DC | PRN

## 2017-08-20 MED ORDER — OXYTOCIN BOLUS FROM INFUSION
500.0000 mL | Freq: Once | INTRAVENOUS | Status: AC
  Administered 2017-08-20: 500 mL via INTRAVENOUS

## 2017-08-20 MED ORDER — IBUPROFEN 600 MG PO TABS
600.0000 mg | ORAL_TABLET | Freq: Four times a day (QID) | ORAL | Status: DC
  Administered 2017-08-20 – 2017-08-22 (×7): 600 mg via ORAL
  Filled 2017-08-20 (×7): qty 1

## 2017-08-20 MED ORDER — OXYCODONE-ACETAMINOPHEN 5-325 MG PO TABS
2.0000 | ORAL_TABLET | ORAL | Status: DC | PRN
  Filled 2017-08-20: qty 2

## 2017-08-20 MED ORDER — TERBUTALINE SULFATE 1 MG/ML IJ SOLN: 0.2500 mg | Freq: Once | INTRAMUSCULAR | Status: DC | PRN

## 2017-08-20 MED ORDER — TRANEXAMIC ACID 1000 MG/10ML IV SOLN
1000.0000 mg | INTRAVENOUS | Status: AC
  Administered 2017-08-20: 1000 mg via INTRAVENOUS
  Filled 2017-08-20: qty 10

## 2017-08-20 MED ORDER — FENTANYL CITRATE (PF) 100 MCG/2ML IJ SOLN
100.0000 ug | INTRAMUSCULAR | Status: DC | PRN
  Administered 2017-08-20 (×3): 100 ug via INTRAVENOUS
  Filled 2017-08-20 (×3): qty 2

## 2017-08-20 MED ORDER — LIDOCAINE HCL (PF) 1 % IJ SOLN
30.0000 mL | INTRAMUSCULAR | Status: DC | PRN
  Filled 2017-08-20: qty 30

## 2017-08-20 MED ORDER — TETANUS-DIPHTH-ACELL PERTUSSIS 5-2.5-18.5 LF-MCG/0.5 IM SUSP: 0.5000 mL | Freq: Once | INTRAMUSCULAR | Status: DC

## 2017-08-20 MED ORDER — BENZOCAINE-MENTHOL 20-0.5 % EX AERO
1.0000 "application " | INHALATION_SPRAY | CUTANEOUS | Status: DC | PRN
  Administered 2017-08-22: 1 via TOPICAL
  Filled 2017-08-20: qty 56

## 2017-08-20 MED ORDER — MISOPROSTOL 200 MCG PO TABS
ORAL_TABLET | ORAL | Status: AC
  Administered 2017-08-20: 800 ug
  Filled 2017-08-20: qty 4

## 2017-08-20 MED ORDER — OXYCODONE-ACETAMINOPHEN 5-325 MG PO TABS
1.0000 | ORAL_TABLET | ORAL | Status: DC | PRN
  Administered 2017-08-21 (×2): 1 via ORAL
  Filled 2017-08-20 (×2): qty 1

## 2017-08-20 MED ORDER — SENNOSIDES-DOCUSATE SODIUM 8.6-50 MG PO TABS
2.0000 | ORAL_TABLET | ORAL | Status: DC
  Administered 2017-08-20 – 2017-08-22 (×2): 2 via ORAL
  Filled 2017-08-20 (×2): qty 2

## 2017-08-20 MED ORDER — COCONUT OIL OIL: 1.0000 "application " | TOPICAL_OIL | Status: DC | PRN

## 2017-08-20 MED ORDER — LACTATED RINGERS IV SOLN: 500.0000 mL | INTRAVENOUS | Status: DC | PRN

## 2017-08-20 MED ORDER — ZOLPIDEM TARTRATE 5 MG PO TABS: 5.0000 mg | ORAL_TABLET | Freq: Every evening | ORAL | Status: DC | PRN

## 2017-08-20 MED ORDER — OXYCODONE-ACETAMINOPHEN 5-325 MG PO TABS: 1.0000 | ORAL_TABLET | ORAL | Status: DC | PRN

## 2017-08-20 MED ORDER — ONDANSETRON HCL 4 MG PO TABS: 4.0000 mg | ORAL_TABLET | ORAL | Status: DC | PRN

## 2017-08-20 NOTE — Anesthesia Pain Management Evaluation Note (Signed)
Spanish Interpreter, Veryl SpeakEtta, utilized for pain consult note.   CRNA Pain Management Visit Note  Patient: Robin ReddenClaudia Herrera Rodriguez, 37 y.o., female  "Hello I am a member of the anesthesia team at [facility]. We have an anesthesia team available at all times to provide care throughout the hospital, including epidural management and anesthesia for C-section. I don't know your plan for the delivery whether it a natural birth, water birth, IV sedation, nitrous supplementation, doula or epidural, but we want to meet your pain goals."   1.Was your pain managed to your expectations on prior hospitalizations?   Yes   2.What is your expectation for pain management during this hospitalization?     IV pain meds  3.How can we help you reach that goal? IV pain meds when ready  Record the patient's initial score and the patient's pain goal.   Pain: 2-3  Pain Goal: 4-5 [facility] you to be able to say your pain was always managed very well.  Johnchristopher Sarvis L 08/20/2017

## 2017-08-20 NOTE — Progress Notes (Signed)
LABOR PROGRESS NOTE  Robin Alvarado is a 37 y.o. [phone] at 2870w0d  admitted for IOL at 39 weeks d/t hx of shoulder dystocias  Subjective: Pt doing well  Objective: BP 139/87   Pulse (!) 59   Temp 97.8 F (36.6 C) (Oral)   Resp 16   Ht 5\' 3"  (1.6 m)   Wt 193 lb 1.9 oz (87.6 kg)   LMP 11/20/2016   BMI 34.21 kg/m  or  Vitals:   08/20/17 1651 08/20/17 1735 08/20/17 1755 08/20/17 1759  BP: 133/87 140/87 139/87   Pulse: 62 (!) 59 (!) 59   Resp: 16 18 16    Temp:    97.8 F (36.6 C)  TempSrc:    Oral  Weight:      Height:        SVE: Dilation: 4 Effacement (%): 80 Cervical Position: Middle Station: -2 Presentation: Vertex Exam by:: Dr. Nira Retortegele FHT: baseline rate 150, moderate varibility, +acel, no decel Toco: ctx q 2-4 min  Assessment / Plan: 37 y.o. 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

## 2017-08-20 NOTE — H&P (Signed)
OBSTETRIC ADMISSION HISTORY AND PHYSICAL  Robin Alvarado is a 37 y.o. female [phone] with IUP at 180w0d by LMP presenting for IOL for h/o shoulder dystocias. Options were discussed with patient at 33-wks about 39-wk IOL vs primary C/S, but patient did not want C/S.   She reports +FMs, No LOF, no VB, no blurry vision, headaches or peripheral edema, and RUQ pain.  She plans on breastfeeding. She request nexplanon for birth control.   Dating: By LMP --->  Estimated Date of Delivery: 08/27/17  Sono: @[redacted]w[redacted]d , CWD, normal anatomy, vertex presentation, 3270, 56.1% EFW  Prenatal History/Complications: She received her [facility] care at [facility].  Pregnancy complications: - Hx of shoulder dystocia x 2 (9lb babies) - Hx PPH - Obesity - Abnormal 1-hr glucola (normal 3-hr GTT) - Patient's brother's two children born with severely limited sight.   Past Medical History: Past Medical History:  Diagnosis Date  . 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 NO PAST SURGERIES    . TOOTH EXTRACTION      Obstetrical History: OB History    Gravida Para Term Preterm AB Living   4 3 3  0 0 3   SAB TAB Ectopic Multiple Live Births   0 0 0 0 3      Social History: Social History   Socioeconomic History  . Marital status: Married    Spouse name: None  . Number of children: None  . Years of education: None  . Highest education level: None  Social Needs  . Financial resource strain: None  . Food insecurity - worry: None  . Food insecurity - inability: None  . Transportation needs - medical: None  . Transportation needs - non-medical: None  Occupational History  . None  Tobacco Use  . Smoking status: Never Smoker  . Smokeless tobacco: Never Used  Substance and Sexual Activity  . Alcohol use: No  . Drug use: No  . Sexual activity: Yes    Birth control/protection:  Other-see comments    Comment: nexplanon  Other Topics Concern  . None  Social History Narrative  . None    Family History: Family History  Problem Relation Age of Onset  . Hypertension Father   . Cancer Maternal Grandfather        maternal  . Diabetes Paternal Uncle   . Anesthesia problems Neg Hx   . Other Neg Hx     Allergies: No Known Allergies  Medications Prior to Admission  Medication Sig Dispense Refill Last Dose  . Prenatal Vit-Fe Fumarate-FA (MULTIVITAMIN-PRENATAL) 27-0.8 MG TABS Take 1 tablet by mouth daily.     Past Week at Unknown time     Review of Systems   All systems reviewed and negative except as stated in HPI  Blood pressure (!) 150/88, pulse 77, temperature 98.3 F (36.8 C), temperature source Oral, resp. rate 16, height 5\' 3"  (1.6 m), weight 87.6 kg (193 lb 1.9 oz), last menstrual period 11/20/2016, unknown if currently breastfeeding. General appearance: alert, cooperative, appears stated age and no distress Lungs: clear to auscultation bilaterally Heart: regular rate and rhythm Abdomen: soft, non-tender; bowel sounds normal Extremities: Homans sign is negative, no sign of DVT Presentation: cephalic Fetal monitoringBaseline: 145 bpm, Variability: Good {> 6 bpm), Accelerations: Reactive and Decelerations: Absent Uterine activity: mild irritability  SVE: Dilation: 3 Effacement (%): 70 Station: -2 Exam by:: Devra DoppAmber Knox, RN  Prenatal labs: ABO, Rh: A/Positive/-- (08/30 0000) Antibody: Negative (08/30 0000) Rubella: Immune (08/30 0000) RPR: Nonreactive (08/30 0000)  HBsAg: Negative (08/30 0000)  HIV: Non-reactive (08/30 0000)  GBS: Negative (12/31 0000)  1-hr Glucola:  1st trimester 144 --> 3 hr GTT: WNL   3rd trimester 138 --> 3 hr GTT: WNL Genetic screening  WNL Anatomy US normal; U/s at 22 weks with EFW at 85th%ile; U/S at 37 wks EFW at  56.1th%ile; 3270g  Prenatal Transfer Tool  Maternal Diabetes: No Genetic Screening:  Normal Maternal Ultrasounds/Referrals: Normal Fetal Ultrasounds or other Referrals:  None Maternal Substance Abuse:  No Significant Maternal Medications:  None Significant Maternal Lab Results: Lab values include: Group B Strep negative  Results for orders placed or performed during the hospital encounter of 08/20/17 (from the past 24 hour(s))  CBC   Collection Time: 08/20/17  8:00 AM  Result Value Ref Range   WBC 5.5 4.0 - 10.5 K/uL   RBC 4.36 3.87 - 5.11 MIL/uL   Hemoglobin 12.7 12.0 - 15.0 g/dL   HCT 16.1 09.6 - 04.5 %   MCV 88.5 78.0 - 100.0 fL   MCH 29.1 26.0 - 34.0 pg   MCHC 32.9 30.0 - 36.0 g/dL   RDW 40.9 81.1 - 91.4 %   Platelets 261 150 - 400 K/uL    Patient Active Problem List   Diagnosis Date Noted  . History of shoulder dystocia in prior pregnancy 08/20/2017  . History of shoulder dystocia in prior pregnancy, currently pregnant in third trimester 07/30/2017  . Advanced maternal age in multigravida, second trimester   . Hereditary disease in family possibly affecting fetus, fetus 1   . Breech birth 01/04/2015  . Pregnancy 01/04/2015  . Third degree perineal laceration 06/07/2013    Assessment/Plan:  Robin Alvarado is a 37 y.o. [phone] at [redacted]w[redacted]d here for IOL for history of shoulder dystocias.  #Labor: start IOL. Cervix favorable, start IV Pit #Pain: IV pain meds #FWB:  Cat I #ID:  GBS neg #MOF: breast #MOC:nexplanon #Circ:  no  Alroy Bailiff, MD  08/20/2017, 9:04 AM   OB FELLOW HISTORY AND PHYSICAL ATTESTATION  I have seen and examined this patient; I agree with above documentation in the resident's note. Plan was discussed with resident.  I have edited note as appropriate  - IV Pitocin started for IOL  - FWB cat I - GBS neg - Shoulder dystocia precautions - PPH precautions  Frederik Pear, MD OB Fellow 08/20/2017, 10:51 AM

## 2017-08-21 LAB — CBC
HCT: 34 % — ABNORMAL LOW (ref 36.0–46.0)
HEMOGLOBIN: 11.3 g/dL — AB (ref 12.0–15.0)
MCH: 29 pg (ref 26.0–34.0)
MCHC: 33.2 g/dL (ref 30.0–36.0)
MCV: 87.4 fL (ref 78.0–100.0)
Platelets: 222 10*3/uL (ref 150–400)
RBC: 3.89 MIL/uL (ref 3.87–5.11)
RDW: 14.4 % (ref 11.5–15.5)
WBC: 8.6 10*3/uL (ref 4.0–10.5)

## 2017-08-21 NOTE — Lactation Note (Signed)
This note was copied from a baby's chart. Lactation Consultation Note Baby 8 hrs old, resting on mom's chest swaddled. Mom speaks Spanish. [employer] Interpreter Michelle Nasutilena [phone] used for consult. Mom's 4th baby. 1st child now 37 yrs old BF 8 months, 4 yr old BF for 8 months, 2 1/37 yr old BF for 2 yrs. Denied difficulty. Reviewed newborn behavior, STS, I&O, cluster feeding and breast massage. Mom encouraged to feed baby 8-12 times/24 hours and with feeding cues.  Mom denies questions or difficulty BF this baby. Mom breast and formula fed other children, probably will do the same for this baby. Mom has small "V" breast wide spaced. Hand expressed flowing colostrum.  Encouraged to call for assistance if needed. WH/LC brochure given w/resources, support groups and LC services.  Patient Name: Robin Murrell ReddenClaudia Herrera Rodriguez MWUXL'KToday's Date: 08/21/2017 Reason for consult: Initial assessment   Maternal Data Has patient been taught Hand Expression?: Yes Does the patient have breastfeeding experience prior to this delivery?: Yes  Feeding Feeding Type: Breast Fed Length of feed: 20 min  LATCH Score       Type of Nipple: Everted at rest and after stimulation  Comfort (Breast/Nipple): Soft / non-tender        Interventions Interventions: Breast feeding basics reviewed  Lactation Tools Discussed/Used WIC Program: Yes   Consult Status Consult Status: Follow-up Date: 08/22/17 Follow-up type: In-patient    Charyl DancerCARVER, Melvenia Favela G 08/21/2017, 4:36 AM

## 2017-08-21 NOTE — Progress Notes (Signed)
POSTPARTUM PROGRESS NOTE  Post Partum Day 1  Subjective:  Robin Alvarado is a 37 y.o. [id number] s/p SVD at 6051w0d.  No acute events overnight.  Pt denies problems with ambulating, voiding or po intake.  She denies nausea or vomiting.  Pain is poorly controlled; she reports pain in her abdomen.  She has had flatus.  Lochia Minimal.   Objective: Blood pressure 116/61, pulse (!) 58, temperature 98.8 F (37.1 C), temperature source Oral, resp. rate 18, height 5\' 3"  (1.6 m), weight 80.9 kg (178 lb 4.8 oz), last menstrual period 11/20/2016, unknown if currently breastfeeding.  Physical Exam:  General: alert, cooperative and no distress Chest: no respiratory distress Abdomen: soft, nontender Uterine Fundus: firm, appropriately tender DVT Evaluation: No calf swelling or tenderness Extremities: No edema Skin: warm, dry  Recent Labs    08/20/17 0800 08/21/17 0514  HGB 12.7 11.3*  HCT 38.6 34.0*    Assessment/Plan: Robin Alvarado is a 37 y.o. [id number] s/p SVD at 5051w0d   PPD#1 - Doing well Contraception: nexplanon Feeding: breast Dispo: Plan for discharge tomorrow.   LOS: 1 day   Mikal PlaneBenjamin A Meshia Rau MS3 08/21/2017, 6:53 AM

## 2017-08-22 DIAGNOSIS — O139 Gestational [pregnancy-induced] hypertension without significant proteinuria, unspecified trimester: Secondary | ICD-10-CM

## 2017-08-22 HISTORY — DX: Gestational (pregnancy-induced) hypertension without significant proteinuria, unspecified trimester: O13.9

## 2017-08-22 MED ORDER — SENNOSIDES-DOCUSATE SODIUM 8.6-50 MG PO TABS: 2.0000 | ORAL_TABLET | ORAL | 0 refills | Status: DC

## 2017-08-22 MED ORDER — IBUPROFEN 600 MG PO TABS: 600.0000 mg | ORAL_TABLET | Freq: Four times a day (QID) | ORAL | 0 refills | Status: DC

## 2017-08-22 NOTE — Discharge Summary (Signed)
OB Discharge Summary     Patient Name: Robin Alvarado DOB: 07-01-1981 MRN: [id number]  Date of admission: 08/20/2017 Delivering MD: Frederik Pear   Date of discharge: 08/22/2017  Admitting diagnosis: INDUCTION Intrauterine pregnancy: [redacted]w[redacted]d     Secondary diagnosis:  Active Problems:   Pregnancy   Advanced maternal age in multigravida, second trimester   History of shoulder dystocia in prior pregnancy, currently pregnant in third trimester   History of shoulder dystocia in prior pregnancy   Normal delivery   Gestational hypertension  Additional problems: PPH     Discharge diagnosis: Term Pregnancy Delivered                                                                                                Post partum procedures:none  Augmentation: Pitocin  Complications: EBL at delivery s/p cytotec and lysteda   Hospital course:  Induction of Labor With Vaginal Delivery   37 y.o. yo [id number] at [redacted]w[redacted]d was admitted to the hospital 08/20/2017 for induction of labor.  Indication for induction: history of shoulder dystocia.  Patient had an uncomplicated labor course as follows: Membrane Rupture Time/Date: 6:24 PM ,08/20/2017   Intrapartum Procedures: Episiotomy: None [1]                                         Lacerations:  1st degree [2]  Patient had delivery of a Viable infant.  Information for the patient's newborn:  Marinda, Tyer [[id number]]  Delivery Method: Vag-Spont   08/20/2017  Details of delivery can be found in separate delivery note.  Patient had a routine postpartum course. Patient is discharged home 08/22/17.  Physical exam  Vitals:   08/21/17 0630 08/21/17 0911 08/21/17 1727 08/22/17 0500  BP:  125/73 119/71 117/71  Pulse:  (!) 57 71 (!) 57  Resp:  18 16 18   Temp:  98 F (36.7 C) 98.1 F (36.7 C) (!) 97.5 F (36.4 C)  TempSrc:  Oral Oral Oral  SpO2:  100% 98%   Weight: 80.9 kg (178 lb 4.8 oz)   81.2 kg (179 lb)  Height:        General: alert, cooperative and no distress Lochia: appropriate Uterine Fundus: firm Incision: N/A DVT Evaluation: No evidence of DVT seen on physical exam. No cords or calf tenderness. No significant calf/ankle edema. Labs: Lab Results  Component Value Date   WBC 8.6 08/21/2017   HGB 11.3 (L) 08/21/2017   HCT 34.0 (L) 08/21/2017   MCV 87.4 08/21/2017   PLT 222 08/21/2017   CMP Latest Ref Rng & Units 08/20/2017  Glucose 65 - 99 mg/dL 696(E)  BUN 6 - 20 mg/dL 8  Creatinine 9.52 - 8.41 mg/dL 3.24  Sodium 401 - 027 mmol/L 135  Potassium 3.5 - 5.1 mmol/L 4.0  Chloride 101 - 111 mmol/L 104  CO2 22 - 32 mmol/L 20(L)  Calcium 8.9 - 10.3 mg/dL 8.9  Total Protein 6.5 - 8.1 g/dL 6.9  Total Bilirubin 0.3 -  1.2 mg/dL 0.3  Alkaline Phos 38 - 126 U/L 212(H)  AST 15 - 41 U/L 25  ALT 14 - 54 U/L 14    Discharge instruction: per After Visit Summary and "Baby and Me Booklet".  After visit meds:  Allergies as of 08/22/2017   No Known Allergies     Medication List    TAKE these medications   ibuprofen 600 MG tablet Commonly known as:  ADVIL,MOTRIN Take 1 tablet (600 mg total) by mouth every 6 (six) hours.   multivitamin-prenatal 27-0.8 MG Tabs tablet Take 1 tablet by mouth daily.   senna-docusate 8.6-50 MG tablet Commonly known as:  Senokot-S Take 2 tablets by mouth daily. Start taking on:  08/23/2017       Diet: routine diet  Activity: Advance as tolerated. Pelvic rest for 6 weeks.   Outpatient follow up:1 week BP check, 4 week PP check Follow up Appt:No future appointments. Follow up Visit:No Follow-up on file. Follow-up Information    [facility] Schedule an appointment as soon as possible for a visit in 4 week(s).   Contact information: [address] [place] [address] [phone]           Postpartum contraception: Nexplanon  Newborn Data: Live born female  Birth Weight: 10 lb 1.2 oz (4570 g) APGAR: 8, 9  Newborn  Delivery   Birth date/time:  08/20/2017 19:49:00 Delivery type:  Vaginal, Spontaneous     Baby Feeding: Breast Disposition:home with mother   08/22/2017 Oralia ManisSherin Ivett Luebbe, DO

## 2017-08-22 NOTE — Progress Notes (Signed)
DC teaching done by day shift RN. I went over papers. Explained baby patient protocols via interpreter. All questions answered. Mom requested formula . Lead done papers given.

## 2017-08-23 ENCOUNTER — Ambulatory Visit: Payer: Self-pay

## 2017-08-23 NOTE — Lactation Note (Addendum)
This note was copied from a baby's chart. Lactation Consultation Note  Patient Name: Boy Murrell ReddenClaudia Herrera Rodriguez ZOXWR'UToday's Date: 08/23/2017 Orlene Plum( VIrdia present for Spanish interpreter.  Reason for consult: Follow-up assessment;Term;Hyperbilirubinemia;Infant weight loss(9% weight loss )  Baby is 1568 hours old LC reviewed doc flow sheets, and updated per mom  LC discussed with mom the findings of the review of doc flow sheets and she confirmed the baby  Hasn't stooled since 1/26 only wets.  Per mom breast are full and milk is in both breast. Baby woke up and LC assisted with pillows and flanging  Upper lip for depth at the breast. Multiple swallows and gulps noted, baby fed for 10 mins, and released on his own.  Nipple well rounded, and fell asleep. LC recommended since milk is in to offer both breast. 1st breast soften well, and then 2nd breast. Skin to skin feedings until the baby is back up to birth weight.  LC discussed how jaundice can make a baby sluggish so its important to watch for hanging out latched.  discussed nutritive vs non - nutritive feeding patterns.  LC instructed mom on the use of DEBP. And LC asked RN Kelby Alinemily Howard to check on mom for the actual  Time for pumping due to mom wanting to eat her lunch .  LC discussed with mom why the DEBP was set up and the to save milk for the next feeding due to EBM cleaning  The gut out better compared to formula. If EBM not available , breast 1st, formula 2nd.       Maternal Data Has patient been taught Hand Expression?: Yes  Feeding Feeding Type: Breast Fed Nipple Type: Slow - flow Length of feed: 10 min(breast full, and baby softened well , baby released after 10 mins )  LATCH Score Latch: Grasps breast easily, tongue down, lips flanged, rhythmical sucking.  Audible Swallowing: Spontaneous and intermittent  Type of Nipple: Everted at rest and after stimulation  Comfort (Breast/Nipple): Filling, red/small blisters or bruises,  mild/mod discomfort  Hold (Positioning): Assistance needed to correctly position infant at breast and maintain latch.  LATCH Score: 8  Interventions Interventions: Breast feeding basics reviewed;Assisted with latch;Skin to skin;Breast massage;Hand express;Breast compression;Adjust position;Support pillows;Position options;DEBP  Lactation Tools Discussed/Used Tools: Pump Breast pump type: Double-Electric Breast Pump;Manual Pump Review: Setup, frequency, and cleaning Initiated by:: MAI  Date initiated:: 08/23/17   Consult Status Consult Status: Follow-up Date: 08/24/17 Follow-up type: In-patient    Matilde SprangMargaret Ann Shonnie Poudrier 08/23/2017, 3:57 PM

## 2017-08-24 ENCOUNTER — Ambulatory Visit: Payer: Self-pay

## 2017-08-24 NOTE — Lactation Note (Signed)
This note was copied from a baby's chart. Lactation Consultation Note  Patient Name: Robin Murrell ReddenClaudia Herrera Alvarado ZOXWR'UToday's Date: 08/24/2017 Reason for consult: Follow-up assessment;Term;Infant weight loss;Hyperbilirubinemia  Used [employer] Interpreter services for language Louann Livline, [place] # [phone]  3289 hours old female who is being partially BF and formula fed by his mother. Per mom BF is going well and her nipples are not as sore as yesterday. Baby has been latching on, he only got a bottle of formula this morning at 8 am. Spoke to mom about the relationship between formula supplementation and engorgement. Mom is aware of engorgement, she's a P4 reviewed treatment. Stressed the importance of providing breastmilk over formula for baby's health and mom's breast comfort to avoid engorgement, her supply seems to be plentiful. Also, instructed mom how to set up her manual pump and discussed breast milk and formula storage. She doesn't have a pump at home, [facility]. Encouraged mom to keep baby STS and to feed on cues; and if baby is not cueing every 3 hours, to put baby STS on the breast and offer a feeding till weight loss is offset.  Maternal Data    Feeding Feeding Type: Breast Fed Length of feed: 10 min  LATCH Score                   Interventions Interventions: Breast feeding basics reviewed;Skin to skin;Breast massage;Hand express;Pre-pump if needed;Breast compression;Expressed milk;Hand pump;DEBP  Lactation Tools Discussed/Used Tools: Pump;Flanges Flange Size: 24;27 Breast pump type: Manual;Double-Electric Breast Pump   Consult Status Consult Status: Complete Date: 08/24/17 Follow-up type: Call as needed    Cherith Tewell S Favian Kittleson 08/24/2017, 1:08 PM

## 2019-06-16 ENCOUNTER — Other Ambulatory Visit: Payer: Self-pay

## 2019-06-16 DIAGNOSIS — Z20822 Contact with and (suspected) exposure to covid-19: Secondary | ICD-10-CM

## 2019-06-19 LAB — NOVEL CORONAVIRUS, NAA: SARS-CoV-2, NAA: NOT DETECTED

## 2021-01-15 ENCOUNTER — Other Ambulatory Visit: Payer: Self-pay | Admitting: Obstetrics and Gynecology

## 2021-01-15 DIAGNOSIS — Z1231 Encounter for screening mammogram for malignant neoplasm of breast: Secondary | ICD-10-CM

## 2021-02-06 ENCOUNTER — Ambulatory Visit: Payer: Self-pay

## 2021-02-25 ENCOUNTER — Ambulatory Visit: Payer: Self-pay

## 2021-03-27 ENCOUNTER — Other Ambulatory Visit: Payer: Self-pay | Admitting: Obstetrics and Gynecology

## 2021-03-27 DIAGNOSIS — Z1231 Encounter for screening mammogram for malignant neoplasm of breast: Secondary | ICD-10-CM

## 2021-04-15 ENCOUNTER — Other Ambulatory Visit: Payer: Self-pay

## 2021-04-15 ENCOUNTER — Ambulatory Visit: Payer: Self-pay | Admitting: *Deleted

## 2021-04-15 ENCOUNTER — Ambulatory Visit
Admission: RE | Admit: 2021-04-15 | Discharge: 2021-04-15 | Disposition: A | Discharge status: Home / Self Care | Payer: No Typology Code available for payment source | Source: Ambulatory Visit | Attending: Obstetrics and Gynecology | Admitting: Obstetrics and Gynecology

## 2021-04-15 VITALS — BP 152/98 | Wt 214.9 lb

## 2021-04-15 DIAGNOSIS — Z1231 Encounter for screening mammogram for malignant neoplasm of breast: Secondary | ICD-10-CM

## 2021-04-15 DIAGNOSIS — Z1239 Encounter for other screening for malignant neoplasm of breast: Secondary | ICD-10-CM

## 2021-04-15 NOTE — Patient Instructions (Addendum)
Explained breast self awareness with Robin Alvarado. Patient did not need a Pap smear today due to last Pap smear and HPV Typing was 12/04/2020. Let her know [facility] will cover Pap smears and HPV typing every 3 years unless has a history of abnormal Pap smears. Referred patient to the [facility] for a screening mammogram on mobile unit. Appointment scheduled Tuesday, April 15, 2021 at 1030. Patient escorted to the mobile unit following [facility] appointment for her screening mammogram. Let patient know the Breast Center will follow up with her within the next couple weeks with results of her mammogram by letter or phone. Robin Alvarado verbalized understanding.  Robin Alvarado, Robin Maser, RN 9:52 AM

## 2021-04-15 NOTE — Progress Notes (Signed)
[name] is a 40 y.o. female who presents to [facility] clinic today with complaint of a lump that was between her breasts two months ago that drained a light red/clear colored drainage. Patient stated that the lump is almost gone. Patient stated the lump was painful at first that she rated at a 9 out of 10. Patient stated the pain has resolved.    Pap Smear: Pap smear not completed today. Last Pap smear was 12/04/2020 at the [facility] clinic and was normal with negative HPV. Per patient has no history of an abnormal Pap smear. Patient stated she has a history of a Pap smear in April or May 2021 that was normal with positive HPV. Last Pap smear result is not available in Epic. Last Pap smear result will be scanned into Epic.   Physical exam: Breasts Right breast larger than left breast that per patient is normal for her. No skin abnormalities bilateral breasts. No nipple retraction bilateral breasts. No nipple discharge bilateral breasts. No lymphadenopathy. No lumps palpated bilateral breasts. No complaints of pain or tenderness on exam.       Pelvic/Bimanual Pap is not indicated today per BCCCP guidelines. Next Pap smear due in one year per ASCCP guidelines.   Smoking History: Patient has never smoked.   Patient Navigation: Patient education provided. Access to services provided for patient through [facility] program. Spanish interpreter Natale Lay from [facility] provided.    Breast and Cervical Cancer Risk Assessment: Patient does not have family history of breast cancer, known genetic mutations, or radiation treatment to the chest before age 59. Patient does not have history of cervical dysplasia, immunocompromised, or DES exposure in-utero.  Risk Assessment     Risk Scores       04/15/2021   Last edited by: Meryl Dare, CMA   5-year risk: 0.4 %   Lifetime risk: 8.9 %            A: BCCCP exam without pap smear Complaint of lump between  breasts that has resolved.  P: Referred patient to the [facility] for a screening mammogram on mobile unit. Appointment scheduled Tuesday, April 15, 2021 at 1030.  Priscille Heidelberg, RN 04/15/2021 9:52 AM

## 2021-05-07 ENCOUNTER — Other Ambulatory Visit: Payer: Self-pay | Admitting: Obstetrics and Gynecology

## 2021-05-07 DIAGNOSIS — R928 Other abnormal and inconclusive findings on diagnostic imaging of breast: Secondary | ICD-10-CM

## 2021-05-15 ENCOUNTER — Ambulatory Visit
Admission: RE | Admit: 2021-05-15 | Discharge: 2021-05-15 | Disposition: A | Discharge status: Home / Self Care | Payer: No Typology Code available for payment source | Source: Ambulatory Visit | Attending: Obstetrics and Gynecology | Admitting: Obstetrics and Gynecology

## 2021-05-15 ENCOUNTER — Other Ambulatory Visit: Payer: Self-pay

## 2021-05-15 ENCOUNTER — Other Ambulatory Visit: Payer: Self-pay | Admitting: Obstetrics and Gynecology

## 2021-05-15 DIAGNOSIS — R928 Other abnormal and inconclusive findings on diagnostic imaging of breast: Secondary | ICD-10-CM

## 2021-05-26 ENCOUNTER — Other Ambulatory Visit: Payer: Self-pay

## 2021-05-26 ENCOUNTER — Inpatient Hospital Stay: Payer: Self-pay | Attending: Obstetrics and Gynecology | Admitting: *Deleted

## 2021-05-26 VITALS — BP 138/92 | Ht 64.0 in | Wt 206.6 lb

## 2021-05-26 DIAGNOSIS — Z0000 Encounter for general adult medical examination without abnormal findings: Secondary | ICD-10-CM

## 2021-05-26 NOTE — Progress Notes (Signed)
[facility] initial screening   Interpreter- Natale Lay, [facility] Laws   Clinical Measurement:  Vitals:   05/26/21 0920  BP: 140/90   Fasting Labs Drawn Today, will review with patient when they result.   Medical History:  Patient states that she does not have high cholesterol, has high blood pressure and she does not have diabetes.  Medications:  Patient states that she does not take medication to lower cholesterol, blood pressure or blood sugar.  Patient does not take an aspirin a day to help prevent a heart attack or stroke.    Blood pressure, self measurement:  Patient states that she does not measure blood pressure from home. She checks her blood pressure N/A. She shares her readings with a health care provider: N/A.   Nutrition: Patient states that on average she eats 1 cups of fruit and 2 cups of vegetables per day. Patient states that she does not eat fish at least 2 times per week. Patient eats more than half servings of whole grains. Patient drinks less than 36 ounces of beverages with added sugar weekly: no. Patient is currently watching sodium or salt intake: yes. In the past 7 days patient has consumed drinks containing alcohol on 0 days. On a day that patient consumes drinks containing alcohol on average 0 drinks are consumed.      Physical activity:  Patient states that she gets 120 minutes of moderate and 0 minutes of vigorous physical activity each week.  Smoking status:  Patient states that she has has never smoked .   Quality of life:  Over the past 2 weeks patient states that she had little interest or pleasure in doing things: not at all. She has been feeling down, depressed or hopeless:not at all.    Risk reduction and counseling:   Health Coaching: Spoke with patient about a heart healthy diet. Spoke with patient about the daily recommendations for fruits and vegetables. Explained that the recommendation is for 2 cups of fruits and 3 cups of vegetables daily. Spoke with  patient about adding fish into weekly diet and how fish can benefit heart health. Patient consumes a variety of whole grain foods currently. Spoke with patient about the recommendation for beverages with added sugars. Patient consumes on average 2 cups of soda a week but drinks agua de fruita daily that has added sugar. Encouraged patient to try and use fruit only to flavor water. Encouraged patient to continue watching sodium intake given elevated BP. Patient walks 3 days a week with her children for 30 minutes.     Navigation:  I will notify patient of lab results.  Patient is aware of 2 more health coaching sessions and a follow up. Will schedule follow-up appointment with Internal Medicine for elevated BP once labs have also resulted.   Time: 30 minutes

## 2021-05-27 LAB — LIPID PANEL
Chol/HDL Ratio: 3.8 ratio (ref 0.0–4.4)
Cholesterol, Total: 129 mg/dL (ref 100–199)
HDL: 34 mg/dL — ABNORMAL LOW (ref >39)
LDL Chol Calc (NIH): 77 mg/dL (ref 0–99)
Triglycerides: 96 mg/dL (ref 0–149)
VLDL Cholesterol Cal: 18 mg/dL (ref 5–40)

## 2021-05-27 LAB — HEMOGLOBIN A1C
Est. average glucose Bld gHb Est-mCnc: 131 mg/dL
Hgb A1c MFr Bld: 6.2 % — ABNORMAL HIGH (ref 4.8–5.6)

## 2021-05-27 LAB — GLUCOSE, RANDOM: Glucose: 100 mg/dL — ABNORMAL HIGH (ref 70–99)

## 2021-06-05 ENCOUNTER — Telehealth: Payer: Self-pay

## 2021-06-05 NOTE — Telephone Encounter (Signed)
Health coaching 2   interpreter- Natale Lay, [employer]   Labs- 129 cholesterol, 77 LDL cholesterol, 96 triglycerides, 34 HDL cholesterol, 6.2 hemoglobin A1C, 100 mean plasma glucose   Patient understands and is aware of her lab results.   Goals-  Reduce the amount of sugars consumed in beverages with added sugars. Patient will reduce the amount of soda to 1 soda per week. Patient will also eliminate sugar from the agua de fruita that she makes and use only fresh fruit to give water water flavor and sweetness.   Add more heart healthy fats in diet (avocados, olive oil, avocado oil, almonds, walnuts, heart healthy fish) Start exercising for 30 minutes a day. Reduce the amount of sweets and sugars consumed in both food and drinks. Reduce the amount of carbs consumed. Spoke with patient about the daily recommendation for carbs/grains. Gave patient examples of serving sizes for common carbs that patient consumes.    Navigation:  Patient is aware of 1 more health coaching sessions and a follow up. Patient is scheduled for follow-up appointment with Internal Medicine on Monday, June 09, 2021 @ 8:45 am. Will refer patients to [facility] program.  Time- 15 minutes

## 2021-06-09 ENCOUNTER — Ambulatory Visit (INDEPENDENT_AMBULATORY_CARE_PROVIDER_SITE_OTHER): Payer: Self-pay | Admitting: Internal Medicine

## 2021-06-09 ENCOUNTER — Encounter: Payer: Self-pay | Admitting: Internal Medicine

## 2021-06-09 VITALS — BP 145/81 | HR 84 | Wt 212.5 lb

## 2021-06-09 DIAGNOSIS — Z0000 Encounter for general adult medical examination without abnormal findings: Secondary | ICD-10-CM | POA: Insufficient documentation

## 2021-06-09 DIAGNOSIS — I10 Essential (primary) hypertension: Secondary | ICD-10-CM | POA: Insufficient documentation

## 2021-06-09 DIAGNOSIS — R7303 Prediabetes: Secondary | ICD-10-CM

## 2021-06-09 DIAGNOSIS — R921 Mammographic calcification found on diagnostic imaging of breast: Secondary | ICD-10-CM

## 2021-06-09 HISTORY — DX: Encounter for general adult medical examination without abnormal findings: Z00.00

## 2021-06-09 MED ORDER — METFORMIN HCL ER 500 MG PO TB24: 500.0000 mg | ORAL_TABLET | Freq: Every day | ORAL | 11 refills | Status: DC

## 2021-06-09 MED ORDER — AMLODIPINE BESYLATE 5 MG PO TABS: 5.0000 mg | ORAL_TABLET | Freq: Every day | ORAL | 11 refills | Status: DC

## 2021-06-09 NOTE — Assessment & Plan Note (Signed)
Mr. Sherlon Handing declined influenza vaccine due to expense.

## 2021-06-09 NOTE — Assessment & Plan Note (Addendum)
Patient presents with elevated blood pressure of 145/81.  She checks her blood pressure at home and it is usually in the 140-150s.  She did report an episode a few weeks ago in which she did not feel well (felt fatigued) and her blood pressure was 200/90.    A/P: Patient presents with Stage II HTN. Goal of blood pressure <130/80.  She is not currently on birth control, so will start Calcium Channel Blocker rather than ARB. Patient was given blood pressure log and instructions reviewed with interpreter for medication and to take blood pressure once daily and record value.  She has a blood pressure cuff at home. -Start Amlodipine 5 mg -Follow-up in four weeks

## 2021-06-09 NOTE — Progress Notes (Signed)
Subjective:  CC: establish care from [facility] program, elevated A1c, high blood pressure  HPI:  [name] is a 40 y.o. spanish-speaking female with a past medical history stated below and presents today to establish care from [facility] program.  She recently completed lab work that showed that she is pre-diabetic.  Her blood pressure has also consistently been elevated. Please see problem based assessment and plan for additional details.  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 30 tablet 0   Multiple Vitamin (MULTIVITAMIN) tablet Take 1 tablet by mouth daily.     No current facility-administered medications on file prior to visit.    Family History  Problem Relation Age of Onset   Hypertension Father    Hypertension Brother    Prostate cancer Maternal Grandfather        maternal   Diabetes Paternal Uncle    Anesthesia problems Neg Hx    Other Neg Hx    Breast cancer Neg Hx     Social History   Socioeconomic History   Marital status: Married    Spouse name: Not on file   Number of children: Not on file   Years of education: Not on file   Highest education level: Not on file  Occupational History   Not on file  Tobacco Use   Smoking  status: Never   Smokeless tobacco: Never  Vaping Use   Vaping Use: Never used  Substance and Sexual Activity   Alcohol use: No   Drug use: Never   Sexual activity: Yes    Birth control/protection: Condom  Other Topics Concern   Not on file  Social History Narrative   Not on file   Social Determinants of Health   Financial Resource Strain: Not on file  Food Insecurity: No Food Insecurity   Worried About Running Out of Food in the Last Year: Never true   Ran Out of Food in the Last Year: Never true  Transportation Needs: No Transportation Needs   Lack of Transportation (Medical): No   Lack of Transportation (Non-Medical): No  Physical Activity: Not on file  Stress: Not on file  Social Connections: Not on file  Intimate Partner Violence: Not on file    Review of Systems: ROS negative except for what is noted on the assessment and plan.  Objective:   Vitals:   06/09/21 0901  BP: (!) 145/81  Pulse: 84  SpO2: 100%  Weight: 212 lb 8 oz (96.4 kg)    Physical Exam: Gen: A&O x3 and in no apparent distress, well appearing and obese. HEENT:    Head - normocephalic, atraumatic.    Eye - visual acuity grossly intact, conjunctiva clear, sclera non-icteric, EOM intact.    Mouth - No obvious caries or periodontal  disease. Neck: no masses or nodules, AROM intact. CV: RRR, no murmurs, S1/S2 presents  Resp: Clear to auscultation bilaterally, normal pulmonary effort Abd: BS (+) x4, soft, non-tender abdomen, without hepatosplenomegaly or masses MSK: Grossly normal AROM and strength x4 extremities. Skin: good skin turgor, no rashes, unusual bruising, or prominent lesions.  Neuro: No focal deficits, grossly normal sensation and coordination.  Psych: Oriented x3 and responding appropriately. Intact memory, normal mood, judgement, affect, and insight.    Assessment & Plan:  See Encounters Tab for problem based charting.  Patient seen with Dr. Laney Pastor Yeng Perz,  D.O. [facility]  PGY-1 Pager: [phone]  Phone: [phone] Date 06/09/2021  Time 10:20 AM

## 2021-06-09 NOTE — Assessment & Plan Note (Addendum)
Patient presents to Internal Medicine Clinic due to A1c of 6.2 05/26/21.  Labs were completed through [facility] program and [name] states that she has started walking more and trying to change her diet.  Discussed with her possibility of starting medication to help decrease blood sugar.  She states that she would like to try medication.    Plan: -Start metformin 500 mg with breakfast ($4 list at [facility]) -Instructions on how to take medication were verbally reviewed with interpreter. -BMP completed today  ADDENDUM 06/10/21: BMP with creatinine within normal limits, called and spoke with patient using interpreter to review results.

## 2021-06-09 NOTE — Patient Instructions (Signed)
[name], it was a pleasure seeing you today!  Today we discussed: Blood pressure Please take you blood pressure once daily around the same time daily and record this.  I am sending in a medication called amlodipine. Take this medication once daily.  Please follow-up next month.  Pre diabetes Please continue with diet and exercise changing you have been making and start taking Metformin 500 mg once daily.   I have ordered the following labs today:   Lab Orders         BMP8+Anion Gap       Referrals ordered today:   Referral Orders  No referral(s) requested today     I have ordered the following medication/changed the following medications:   Stop the following medications: There are no discontinued medications.   Start the following medications: Meds ordered this encounter  Medications   metFORMIN (GLUCOPHAGE XR) 500 MG 24 hr tablet    Sig: Take 1 tablet (500 mg total) by mouth daily with breakfast.    Dispense:  30 tablet    Refill:  11   amLODipine (NORVASC) 5 MG tablet    Sig: Take 1 tablet (5 mg total) by mouth daily.    Dispense:  30 tablet    Refill:  11     Follow-up:  1 month    Please make sure to arrive 15 minutes prior to your next appointment. If you arrive late, you may be asked to reschedule.   We look forward to seeing you next time. Please call our clinic at [phone] if you have any questions or concerns. The best time to call is Monday-Friday from 9am-4pm, but there is someone available 24/7. If after hours or the weekend, call the main hospital number and ask for the Internal Medicine Resident On-Call. If you need medication refills, please notify your pharmacy one week in advance and they will send [place] a request.  Thank you for letting us take part in your care. Wishing you the best!  Thank you, Dr. [facility]

## 2021-06-09 NOTE — Assessment & Plan Note (Signed)
Right breast calcifications seen via mammogram on 05/15/2021.  Likely benign RIGHT breast calcifications. Six-month follow-up recommended to ensure stability.  Mammogram scheduled for 10/2021.

## 2021-06-10 LAB — BMP8+ANION GAP
Anion Gap: 15 mmol/L (ref 10.0–18.0)
BUN/Creatinine Ratio: 17 (ref 9–23)
BUN: 11 mg/dL (ref 6–24)
CO2: 22 mmol/L (ref 20–29)
Calcium: 10.1 mg/dL (ref 8.7–10.2)
Chloride: 102 mmol/L (ref 96–106)
Creatinine, Ser: 0.64 mg/dL (ref 0.57–1.00)
Glucose: 120 mg/dL — ABNORMAL HIGH (ref 70–99)
Potassium: 4.7 mmol/L (ref 3.5–5.2)
Sodium: 139 mmol/L (ref 134–144)
eGFR: 114 mL/min/{1.73_m2} (ref >59)

## 2021-06-13 NOTE — Progress Notes (Signed)
Internal Medicine Clinic Attending  I saw and evaluated the patient.  I personally confirmed the key portions of the history and exam documented by Dr. Masters and I reviewed pertinent patient test results.  The assessment, diagnosis, and plan were formulated together and I agree with the documentation in the resident's note.  

## 2021-07-07 ENCOUNTER — Telehealth: Payer: Self-pay | Admitting: *Deleted

## 2021-07-07 ENCOUNTER — Encounter: Payer: No Typology Code available for payment source | Admitting: Internal Medicine

## 2021-07-07 NOTE — Telephone Encounter (Signed)
Called patient regarding her missed appointment. Patient to call the main number at [phone].

## 2021-11-17 ENCOUNTER — Ambulatory Visit
Admission: RE | Admit: 2021-11-17 | Discharge: 2021-11-17 | Disposition: A | Discharge status: Home / Self Care | Payer: No Typology Code available for payment source | Source: Ambulatory Visit | Attending: Obstetrics and Gynecology | Admitting: Obstetrics and Gynecology

## 2021-11-17 ENCOUNTER — Other Ambulatory Visit: Payer: Self-pay | Admitting: Obstetrics and Gynecology

## 2021-11-17 DIAGNOSIS — R928 Other abnormal and inconclusive findings on diagnostic imaging of breast: Secondary | ICD-10-CM

## 2021-11-17 DIAGNOSIS — R921 Mammographic calcification found on diagnostic imaging of breast: Secondary | ICD-10-CM

## 2021-11-21 ENCOUNTER — Ambulatory Visit
Admission: RE | Admit: 2021-11-21 | Discharge: 2021-11-21 | Disposition: A | Discharge status: Home / Self Care | Payer: No Typology Code available for payment source | Source: Ambulatory Visit | Attending: Obstetrics and Gynecology | Admitting: Obstetrics and Gynecology

## 2021-11-21 DIAGNOSIS — R921 Mammographic calcification found on diagnostic imaging of breast: Secondary | ICD-10-CM

## 2021-11-25 ENCOUNTER — Ambulatory Visit: Payer: Self-pay | Admitting: Surgery

## 2021-11-25 NOTE — H&P (Signed)
?Subjective  ? ?Chief Complaint: Breast Problem ?  ?BCCCP - Dr. Jolayne Panther ? ?History of Present Illness: ?Robin Alvarado is a 41 y.o. female who is seen today as an office consultation at the request of Dr. Sloan Leiter for evaluation of Breast Problem ?Marland Kitchen   ?This is a 41 year old female in good health with no family history of breast cancer who presents after her initial screening mammogram revealed multiple microcalcifications just deep to the right nipple.  Attempts were made to perform a stereotactic biopsy but the calcifications are too superficial.  She is then referred to [place] for consideration of excisional biopsy. ? ?No previous breast surgery.  No previous breast problems.  No family history of breast cancer. ?Review of Systems: ?A complete review of systems was obtained from the patient.  I have reviewed this information and discussed as appropriate with the patient.  See HPI as well for other ROS. ? ?Review of Systems  ?Constitutional: Positive for weight loss.  ?HENT: Negative.   ?Eyes: Positive for blurred vision.  ?Respiratory: Negative.   ?Cardiovascular: Negative.   ?Gastrointestinal: Negative.   ?Genitourinary: Negative.   ?Musculoskeletal: Negative.   ?Skin: Negative.   ?Neurological: Negative.   ?Endo/Heme/Allergies: Negative.   ?Psychiatric/Behavioral: Negative.   ?  ? ? ?Medical History: ?Past Medical History:  ?Diagnosis Date  ? Diabetes mellitus without complication (CMS-HCC)   ? Hypertension   ? ? ?Patient Active Problem List  ?Diagnosis  ? Breast calcifications on mammogram  ? Hypertension  ? Pre-diabetes  ? ? ?History reviewed. No pertinent surgical history.  ? ?No Known Allergies ? ?Current Outpatient Medications on File Prior to Visit  ?Medication Sig Dispense Refill  ? amLODIPine (NORVASC) 5 MG tablet Take 5 mg by mouth once daily    ? metFORMIN (GLUCOPHAGE-XR) 500 MG XR tablet Take 500 mg by mouth daily with breakfast    ? multivitamin tablet Take 1 tablet by mouth once daily     ? ?No current facility-administered medications on file prior to visit.  ? ? ?Family History  ?Problem Relation Age of Onset  ? Obesity Mother   ? High blood pressure (Hypertension) Father   ? High blood pressure (Hypertension) Brother   ?  ? ?Social History  ? ?Tobacco Use  ?Smoking Status Never  ?Smokeless Tobacco Never  ?  ? ?Social History  ? ?Socioeconomic History  ? Marital status: Married  ?Tobacco Use  ? Smoking status: Never  ? Smokeless tobacco: Never  ?Vaping Use  ? Vaping Use: Never used  ?Substance and Sexual Activity  ? Alcohol use: Never  ? Drug use: Never  ? ? ?Objective:  ? ? ?Vitals:  ? 11/25/21 1531  ?BP: 130/80  ?Pulse: 82  ?SpO2: 97%  ?Weight: 83.6 kg (184 lb 6 oz)  ?Height: 162.6 cm (5\' 4" )  ?  ?Body mass index is 31.65 kg/m?. ? ?Physical Exam  ? ?Constitutional:  WDWN in NAD, conversant, no obvious deformities; lying in bed comfortably ?Eyes:  Pupils equal, round; sclera anicteric; moist conjunctiva; no lid lag ?HENT:  Oral mucosa moist; good dentition  ?Neck:  No masses palpated, trachea midline; no thyromegaly ?Lungs:  CTA bilaterally; normal respiratory effort ?Breasts:  symmetric, no nipple changes; no palpable masses or lymphadenopathy on either side ?CV:  Regular rate and rhythm; no murmurs; extremities well-perfused with no edema ?Abd:  +bowel sounds, soft, non-tender, no palpable organomegaly; no palpable hernias ?Musc:  Normal gait; no apparent clubbing or cyanosis in extremities ?Lymphatic:  No palpable  cervical or axillary lymphadenopathy ?Skin:  Warm, dry; no sign of jaundice ?Psychiatric - alert and oriented x 4; calm mood and affect ? ? ?Labs, Imaging and Diagnostic Testing: ?CLINICAL DATA:  41 year old female for further evaluation of RIGHT ?breast calcifications identified on baseline screening mammogram. ?  ?EXAM: ?DIGITAL DIAGNOSTIC UNILATERAL RIGHT MAMMOGRAM ?  ?TECHNIQUE: ?Right digital diagnostic mammography was performed. Mammographic ?images were processed with CAD. ?   ?COMPARISON:  Previous exam(s). ?  ?ACR Breast Density Category c: The breast tissue is heterogeneously ?dense, which may obscure small masses. ?  ?FINDINGS: ?Full field and magnification views of the RIGHT breast demonstrates ?scattered calcifications within the RETROAREOLAR RIGHT breast, many ?which are round/punctate. ?  ?No associated mass or distortion is identified. ?  ?IMPRESSION: ?Likely benign RIGHT breast calcifications. Six-month follow-up ?recommended to ensure stability. ?  ?RECOMMENDATION: ?RIGHT diagnostic mammogram with magnification views in 6 months. ?  ?I have discussed the findings and recommendations with the patient ?through an interpreter service. If applicable, a reminder letter ?will be sent to the patient regarding the next appointment. ?  ?BI-RADS CATEGORY  3: Probably benign. ?  ?  ?Electronically Signed ?  By: Harmon Pier M.D. ?  On: 05/15/2021 11:04 ?  ?ADDENDUM REPORT: 11/21/2021 08:57 ?  ?ADDENDUM: ?The patient presented for her stereotactic biopsy of the right ?breast calcifications. Despite multiple attempts at repositioning, ?the calcifications were ultimately not amenable to biopsy due to ?their proximity to the nipple and the skin. The patient will be ?referred to surgery to discuss surgical excisional biopsy of the ?calcifications. We will contact the patient once the appointment has ?been made. ?  ?  ?Electronically Signed ?  By: Frederico Hamman M.D. ?  On: 11/21/2021 08:57 ? ?Assessment and Plan:  ?Diagnoses and all orders for this visit: ? ?Microcalcification of right breast on mammogram ? ?  ? ?Due to the inability to obtain a stereotactic biopsy due to the superficial location of these microcalcifications, would recommend right breast excisional biopsy of the retroareolar region. ?No follow-ups on file. ? ?Zavian Slowey Delbert Harness, MD  ?11/25/2021 ?3:40 PM ? ?

## 2021-12-25 ENCOUNTER — Telehealth: Payer: Self-pay

## 2021-12-25 NOTE — Telephone Encounter (Signed)
Health Coaching 3  interpreter- Natale Lay, [facility]   Goals- Patient has made changes with diet. Patient has reduced the amount of breads and sugars consumed. Patient has also started doing zumba daily for 1 hour.   New goal- Continue with diet and exercise changes.   Barrier to reaching goal-    Strategies to overcome-    Navigation:  Patient is aware of  a follow up session. Patient is schedueld for FU on Monday, February 02, 2022 @ 2:45 pm.   Time- 10 minutes

## 2022-01-05 ENCOUNTER — Other Ambulatory Visit: Payer: Self-pay

## 2022-01-05 ENCOUNTER — Encounter (HOSPITAL_BASED_OUTPATIENT_CLINIC_OR_DEPARTMENT_OTHER): Payer: Self-pay | Admitting: Surgery

## 2022-01-07 ENCOUNTER — Encounter (HOSPITAL_BASED_OUTPATIENT_CLINIC_OR_DEPARTMENT_OTHER)
Admission: RE | Admit: 2022-01-07 | Discharge: 2022-01-07 | Disposition: A | Discharge status: Home / Self Care | Payer: No Typology Code available for payment source | Source: Ambulatory Visit | Attending: Surgery | Admitting: Surgery

## 2022-01-07 DIAGNOSIS — E119 Type 2 diabetes mellitus without complications: Secondary | ICD-10-CM | POA: Insufficient documentation

## 2022-01-07 DIAGNOSIS — Z01818 Encounter for other preprocedural examination: Secondary | ICD-10-CM | POA: Insufficient documentation

## 2022-01-07 LAB — BASIC METABOLIC PANEL
Anion gap: 9 (ref 5–15)
BUN: 7 mg/dL (ref 6–20)
CO2: 22 mmol/L (ref 22–32)
Calcium: 9.3 mg/dL (ref 8.9–10.3)
Chloride: 108 mmol/L (ref 98–111)
Creatinine, Ser: 0.6 mg/dL (ref 0.44–1.00)
GFR, Estimated: >60 mL/min (ref >60)
Glucose, Bld: 118 mg/dL — ABNORMAL HIGH (ref 70–99)
Potassium: 4.4 mmol/L (ref 3.5–5.1)
Sodium: 139 mmol/L (ref 135–145)

## 2022-01-07 NOTE — Progress Notes (Signed)
       Patient Instructions  The night before surgery:  No food after midnight. ONLY clear liquids after midnight  The day of surgery (if you do NOT have diabetes):  Drink ONE (1) Pre-Surgery Clear Ensure as directed.   This drink was given to you during your hospital  pre-op appointment visit. The pre-op nurse will instruct you on the time to drink the  Pre-Surgery Ensure depending on your surgery time. Finish the drink at the designated time by the pre-op nurse.  Nothing else to drink after completing the  Pre-Surgery Clear Ensure.  The day of surgery (if you have diabetes): Drink ONE (1) Gatorade 2 (G2) as directed. This drink was given to you during your hospital  pre-op appointment visit.  The pre-op nurse will instruct you on the time to drink the   Gatorade 2 (G2) depending on your surgery time. Color of the Gatorade may vary. Red is not allowed. Nothing else to drink after completing the  Gatorade 2 (G2).         If you have questions, please contact your surgeon's office.Patient was provided with CHG cleanser to use at home before the procedure. Patient verbalized understanding of instructions. 

## 2022-01-13 ENCOUNTER — Other Ambulatory Visit: Payer: Self-pay

## 2022-01-13 ENCOUNTER — Ambulatory Visit (HOSPITAL_BASED_OUTPATIENT_CLINIC_OR_DEPARTMENT_OTHER)
Admission: RE | Admit: 2022-01-13 | Discharge: 2022-01-13 | Disposition: A | Discharge status: Home / Self Care | Payer: No Typology Code available for payment source | Attending: Surgery | Admitting: Surgery

## 2022-01-13 ENCOUNTER — Encounter (HOSPITAL_BASED_OUTPATIENT_CLINIC_OR_DEPARTMENT_OTHER)
Admission: RE | Disposition: A | Discharge status: Home / Self Care | Payer: Self-pay | Source: Home / Self Care | Attending: Surgery

## 2022-01-13 ENCOUNTER — Encounter (HOSPITAL_BASED_OUTPATIENT_CLINIC_OR_DEPARTMENT_OTHER): Payer: Self-pay | Admitting: Surgery

## 2022-01-13 ENCOUNTER — Ambulatory Visit (HOSPITAL_BASED_OUTPATIENT_CLINIC_OR_DEPARTMENT_OTHER): Payer: No Typology Code available for payment source | Admitting: Anesthesiology

## 2022-01-13 DIAGNOSIS — N6021 Fibroadenosis of right breast: Secondary | ICD-10-CM | POA: Insufficient documentation

## 2022-01-13 DIAGNOSIS — R920 Mammographic microcalcification found on diagnostic imaging of breast: Secondary | ICD-10-CM

## 2022-01-13 DIAGNOSIS — N6081 Other benign mammary dysplasias of right breast: Secondary | ICD-10-CM | POA: Insufficient documentation

## 2022-01-13 DIAGNOSIS — Z79899 Other long term (current) drug therapy: Secondary | ICD-10-CM | POA: Insufficient documentation

## 2022-01-13 DIAGNOSIS — R7303 Prediabetes: Secondary | ICD-10-CM | POA: Insufficient documentation

## 2022-01-13 DIAGNOSIS — I10 Essential (primary) hypertension: Secondary | ICD-10-CM | POA: Insufficient documentation

## 2022-01-13 DIAGNOSIS — E119 Type 2 diabetes mellitus without complications: Secondary | ICD-10-CM

## 2022-01-13 DIAGNOSIS — Z7984 Long term (current) use of oral hypoglycemic drugs: Secondary | ICD-10-CM | POA: Insufficient documentation

## 2022-01-13 HISTORY — PX: EXCISION OF BREAST BIOPSY: SHX5822

## 2022-01-13 HISTORY — DX: Type 2 diabetes mellitus without complications: E11.9

## 2022-01-13 LAB — POCT PREGNANCY, URINE: Preg Test, Ur: NEGATIVE

## 2022-01-13 LAB — GLUCOSE, CAPILLARY
Glucose-Capillary: 91 mg/dL (ref 70–99)
Glucose-Capillary: 120 mg/dL — ABNORMAL HIGH (ref 70–99)

## 2022-01-13 SURGERY — EXCISION OF BREAST BIOPSY
Anesthesia: General | Site: Breast | Laterality: Right

## 2022-01-13 MED ORDER — DEXAMETHASONE SODIUM PHOSPHATE 4 MG/ML IJ SOLN
INTRAMUSCULAR | Status: DC | PRN
  Administered 2022-01-13: 5 mg via INTRAVENOUS

## 2022-01-13 MED ORDER — ONDANSETRON HCL 4 MG/2ML IJ SOLN
INTRAMUSCULAR | Status: AC
Start: 2022-01-13 — End: ?
  Filled 2022-01-13: qty 2

## 2022-01-13 MED ORDER — MIDAZOLAM HCL 2 MG/2ML IJ SOLN
INTRAMUSCULAR | Status: AC
  Filled 2022-01-13: qty 2

## 2022-01-13 MED ORDER — BUPIVACAINE HCL (PF) 0.25 % IJ SOLN
INTRAMUSCULAR | Status: AC
  Filled 2022-01-13: qty 30

## 2022-01-13 MED ORDER — FENTANYL CITRATE (PF) 100 MCG/2ML IJ SOLN
INTRAMUSCULAR | Status: DC | PRN
  Administered 2022-01-13 (×2): 25 ug via INTRAVENOUS
  Administered 2022-01-13: 50 ug via INTRAVENOUS

## 2022-01-13 MED ORDER — LIDOCAINE-EPINEPHRINE (PF) 1 %-1:200000 IJ SOLN
INTRAMUSCULAR | Status: AC
  Filled 2022-01-13: qty 30

## 2022-01-13 MED ORDER — MIDAZOLAM HCL 5 MG/5ML IJ SOLN
INTRAMUSCULAR | Status: DC | PRN
  Administered 2022-01-13: 2 mg via INTRAVENOUS

## 2022-01-13 MED ORDER — FENTANYL CITRATE (PF) 100 MCG/2ML IJ SOLN: 25.0000 ug | INTRAMUSCULAR | Status: DC | PRN

## 2022-01-13 MED ORDER — LACTATED RINGERS IV SOLN: INTRAVENOUS | Status: DC

## 2022-01-13 MED ORDER — BUPIVACAINE-EPINEPHRINE 0.25% -1:200000 IJ SOLN
INTRAMUSCULAR | Status: DC | PRN
  Administered 2022-01-13: 10 mL

## 2022-01-13 MED ORDER — DEXAMETHASONE SODIUM PHOSPHATE 10 MG/ML IJ SOLN
INTRAMUSCULAR | Status: AC
  Filled 2022-01-13: qty 1

## 2022-01-13 MED ORDER — ONDANSETRON HCL 4 MG/2ML IJ SOLN
INTRAMUSCULAR | Status: DC | PRN
  Administered 2022-01-13: 4 mg via INTRAVENOUS

## 2022-01-13 MED ORDER — ACETAMINOPHEN 500 MG PO TABS
1000.0000 mg | ORAL_TABLET | ORAL | Status: AC
  Administered 2022-01-13: 1000 mg via ORAL

## 2022-01-13 MED ORDER — PROPOFOL 10 MG/ML IV BOLUS
INTRAVENOUS | Status: DC | PRN
  Administered 2022-01-13: 150 mg via INTRAVENOUS

## 2022-01-13 MED ORDER — ACETAMINOPHEN 500 MG PO TABS
ORAL_TABLET | ORAL | Status: AC
  Filled 2022-01-13: qty 2

## 2022-01-13 MED ORDER — AMISULPRIDE (ANTIEMETIC) 5 MG/2ML IV SOLN: 10.0000 mg | Freq: Once | INTRAVENOUS | Status: DC | PRN

## 2022-01-13 MED ORDER — CEFAZOLIN SODIUM-DEXTROSE 2-4 GM/100ML-% IV SOLN
2.0000 g | INTRAVENOUS | Status: AC
  Administered 2022-01-13: 2 g via INTRAVENOUS

## 2022-01-13 MED ORDER — LIDOCAINE 2% (20 MG/ML) 5 ML SYRINGE
INTRAMUSCULAR | Status: DC | PRN
  Administered 2022-01-13: 60 mg via INTRAVENOUS

## 2022-01-13 MED ORDER — KETOROLAC TROMETHAMINE 30 MG/ML IJ SOLN: 30.0000 mg | Freq: Once | INTRAMUSCULAR | Status: DC | PRN

## 2022-01-13 MED ORDER — CEFAZOLIN SODIUM-DEXTROSE 2-4 GM/100ML-% IV SOLN
INTRAVENOUS | Status: AC
  Filled 2022-01-13: qty 100

## 2022-01-13 MED ORDER — OXYCODONE HCL 5 MG/5ML PO SOLN: 5.0000 mg | Freq: Once | ORAL | Status: DC | PRN

## 2022-01-13 MED ORDER — PROPOFOL 10 MG/ML IV BOLUS
INTRAVENOUS | Status: AC
  Filled 2022-01-13: qty 20

## 2022-01-13 MED ORDER — CHLORHEXIDINE GLUCONATE CLOTH 2 % EX PADS: 6.0000 | MEDICATED_PAD | Freq: Once | CUTANEOUS | Status: DC

## 2022-01-13 MED ORDER — OXYCODONE HCL 5 MG PO TABS: 5.0000 mg | ORAL_TABLET | Freq: Once | ORAL | Status: DC | PRN

## 2022-01-13 MED ORDER — BUPIVACAINE-EPINEPHRINE (PF) 0.25% -1:200000 IJ SOLN
INTRAMUSCULAR | Status: AC
  Filled 2022-01-13: qty 120

## 2022-01-13 MED ORDER — FENTANYL CITRATE (PF) 100 MCG/2ML IJ SOLN
INTRAMUSCULAR | Status: AC
  Filled 2022-01-13: qty 2

## 2022-01-13 SURGICAL SUPPLY — 50 items
APL PRP STRL LF DISP 70% ISPRP (MISCELLANEOUS) ×1
APL SKNCLS STERI-STRIP NONHPOA (GAUZE/BANDAGES/DRESSINGS) ×1
APL SWBSTK 6 STRL LF DISP (MISCELLANEOUS)
APPLICATOR COTTON TIP 6 STRL (MISCELLANEOUS) IMPLANT
APPLICATOR COTTON TIP 6IN STRL (MISCELLANEOUS)
APPLIER CLIP 9.375 MED OPEN (MISCELLANEOUS) ×2
APR CLP MED 9.3 20 MLT OPN (MISCELLANEOUS) ×1
BENZOIN TINCTURE PRP APPL 2/3 (GAUZE/BANDAGES/DRESSINGS) ×2 IMPLANT
BLADE HEX COATED 2.75 (ELECTRODE) ×1 IMPLANT
BLADE SURG 15 STRL LF DISP TIS (BLADE) ×1 IMPLANT
BLADE SURG 15 STRL SS (BLADE) ×2
CANISTER SUCT 1200ML W/VALVE (MISCELLANEOUS) ×2 IMPLANT
CHLORAPREP W/TINT 26 (MISCELLANEOUS) ×2 IMPLANT
CLIP APPLIE 9.375 MED OPEN (MISCELLANEOUS) IMPLANT
COVER BACK TABLE 60X90IN (DRAPES) ×2 IMPLANT
COVER MAYO STAND STRL (DRAPES) ×2 IMPLANT
DRAPE LAPAROTOMY 100X72 PEDS (DRAPES) ×2 IMPLANT
DRAPE UTILITY XL STRL (DRAPES) ×2 IMPLANT
DRSG TEGADERM 4X4.75 (GAUZE/BANDAGES/DRESSINGS) ×2 IMPLANT
ELECT REM PT RETURN 9FT ADLT (ELECTROSURGICAL) ×2
ELECTRODE REM PT RTRN 9FT ADLT (ELECTROSURGICAL) ×1 IMPLANT
GAUZE SPONGE 4X4 12PLY STRL LF (GAUZE/BANDAGES/DRESSINGS) ×2 IMPLANT
GLOVE BIO SURGEON STRL SZ7 (GLOVE) ×2 IMPLANT
GLOVE BIOGEL PI IND STRL 7.0 (GLOVE) IMPLANT
GLOVE BIOGEL PI IND STRL 7.5 (GLOVE) ×1 IMPLANT
GLOVE BIOGEL PI INDICATOR 7.0 (GLOVE) ×2
GLOVE BIOGEL PI INDICATOR 7.5 (GLOVE) ×1
GLOVE SURG SS PI 6.5 STRL IVOR (GLOVE) ×1 IMPLANT
GOWN STRL REUS W/ TWL LRG LVL3 (GOWN DISPOSABLE) ×2 IMPLANT
GOWN STRL REUS W/TWL LRG LVL3 (GOWN DISPOSABLE) ×6
KIT MARKER MARGIN INK (KITS) ×1 IMPLANT
NDL HYPO 25X1 1.5 SAFETY (NEEDLE) ×1 IMPLANT
NEEDLE HYPO 25X1 1.5 SAFETY (NEEDLE) ×2 IMPLANT
NS IRRIG 1000ML POUR BTL (IV SOLUTION) ×2 IMPLANT
PACK BASIN DAY SURGERY FS (CUSTOM PROCEDURE TRAY) ×2 IMPLANT
PENCIL SMOKE EVACUATOR (MISCELLANEOUS) ×2 IMPLANT
SLEEVE SCD COMPRESS KNEE MED (STOCKING) ×2 IMPLANT
SPIKE FLUID TRANSFER (MISCELLANEOUS) IMPLANT
SPONGE T-LAP 4X18 ~~LOC~~+RFID (SPONGE) ×2 IMPLANT
STRIP CLOSURE SKIN 1/2X4 (GAUZE/BANDAGES/DRESSINGS) ×2 IMPLANT
SUT CHROMIC 3 0 SH 27 (SUTURE) IMPLANT
SUT MON AB 4-0 PC3 18 (SUTURE) ×2 IMPLANT
SUT SILK 2 0 SH (SUTURE) IMPLANT
SUT VIC AB 3-0 SH 27 (SUTURE) ×2
SUT VIC AB 3-0 SH 27X BRD (SUTURE) ×1 IMPLANT
SYR CONTROL 10ML LL (SYRINGE) ×2 IMPLANT
TOWEL GREEN STERILE FF (TOWEL DISPOSABLE) ×2 IMPLANT
TRAY FAXITRON CT DISP (TRAY / TRAY PROCEDURE) ×1 IMPLANT
TUBE CONNECTING 20X1/4 (TUBING) ×2 IMPLANT
YANKAUER SUCT BULB TIP NO VENT (SUCTIONS) ×2 IMPLANT

## 2022-01-13 NOTE — Discharge Instructions (Addendum)
TOOK TYLENOL at 6:45 am. No tylenol products till after 1pm    Post Anesthesia Home Care Instructions  Activity: Get plenty of rest for the remainder of the day. A responsible individual must stay with you for 24 hours following the procedure.  For the next 24 hours, DO NOT: -Drive a car -Advertising copywriter -Drink alcoholic beverages -Take any medication unless instructed by your physician -Make any legal decisions or sign important papers.  Meals: Start with liquid foods such as gelatin or soup. Progress to regular foods as tolerated. Avoid greasy, spicy, heavy foods. If nausea and/or vomiting occur, drink only clear liquids until the nausea and/or vomiting subsides. Call your physician if vomiting continues.  Special Instructions/Symptoms: Your throat may feel dry or sore from the anesthesia or the breathing tube placed in your throat during surgery. If this causes discomfort, gargle with warm salt water. The discomfort should disappear within 24 hours.  If you had a scopolamine patch placed behind your ear for the management of post- operative nausea and/or vomiting:  1. The medication in the patch is effective for 72 hours, after which it should be removed.  Wrap patch in a tissue and discard in the trash. Wash hands thoroughly with soap and water. 2. You may remove the patch earlier than 72 hours if you experience unpleasant side effects which may include dry mouth, dizziness or visual disturbances. 3. Avoid touching the patch. Wash your hands with soap and water after contact with the patch.         [facility] Office Phone Number [phone]  BREAST BIOPSY/ PARTIAL MASTECTOMY: POST OP INSTRUCTIONS  Always review your discharge instruction sheet given to you by the facility where your surgery was performed.  IF YOU HAVE DISABILITY OR FAMILY LEAVE FORMS, YOU MUST BRING THEM TO THE OFFICE FOR PROCESSING.  DO NOT GIVE THEM TO YOUR DOCTOR.  A prescription for  pain medication may be given to you upon discharge.  Take your pain medication as prescribed, if needed.  If narcotic pain medicine is not needed, then you may take acetaminophen (Tylenol) or ibuprofen (Advil) as needed. Take your usually prescribed medications unless otherwise directed If you need a refill on your pain medication, please contact your pharmacy.  They will contact our office to request authorization.  Prescriptions will not be filled after 5pm or on week-ends. You should eat very light the first 24 hours after surgery, such as soup, crackers, pudding, etc.  Resume your normal diet the day after surgery. Most patients will experience some swelling and bruising in the breast.  Ice packs and a good support bra will help.  Swelling and bruising can take several days to resolve.  It is common to experience some constipation if taking pain medication after surgery.  Increasing fluid intake and taking a stool softener will usually help or prevent this problem from occurring.  A mild laxative (Milk of Magnesia or Miralax) should be taken according to package directions if there are no bowel movements after 48 hours. Unless discharge instructions indicate otherwise, you may remove your bandages 24-48 hours after surgery, and you may shower at that time.  You may have steri-strips (small skin tapes) in place directly over the incision.  These strips should be left on the skin for 7-10 days.  If your surgeon used skin glue on the incision, you may shower in 24 hours.  The glue will flake off over the next 2-3 weeks.  Any sutures or staples will  be removed at the office during your follow-up visit. ACTIVITIES:  You may resume regular daily activities (gradually increasing) beginning the next day.  Wearing a good support bra or sports bra minimizes pain and swelling.  You may have sexual intercourse when it is comfortable. You may drive when you no longer are taking prescription pain medication, you can  comfortably wear a seatbelt, and you can safely maneuver your car and apply brakes. RETURN TO WORK:  ______________________________________________________________________________________ Robin Alvarado should see your doctor in the office for a follow-up appointment approximately two weeks after your surgery.  Your doctor's nurse will typically make your follow-up appointment when she calls you with your pathology report.  Expect your pathology report 2-3 business days after your surgery.  You may call to check if you do not hear from [place] after three days. OTHER INSTRUCTIONS: _______________________________________________________________________________________________ _____________________________________________________________________________________________________________________________________ _____________________________________________________________________________________________________________________________________ _____________________________________________________________________________________________________________________________________  WHEN TO CALL YOUR DOCTOR: Fever over 101.0 Nausea and/or vomiting. Extreme swelling or bruising. Continued bleeding from incision. Increased pain, redness, or drainage from the incision.  The clinic staff is available to answer your questions during regular business hours.  Please don't hesitate to call and ask to speak to one of the nurses for clinical concerns.  If you have a medical emergency, go to the nearest emergency room or call 911.  A surgeon from [facility] is always on call at the hospital.  For further questions, please visit [email or web address]

## 2022-01-13 NOTE — Op Note (Signed)
Preop diagnosis: Right breast microcalcifications Postop diagnosis: Same Procedure performed: Right breast excisional biopsy Surgeon:[name] K Amily Depp Anesthesia: General via LMA Indications:This is a 41 year old female in good health with no family history of breast cancer who presents after her initial screening mammogram revealed multiple microcalcifications just deep to the right nipple.  Attempts were made to perform a stereotactic biopsy but the calcifications are too superficial.  She is then referred to [place] for consideration of excisional biopsy.  After discussion with the patient, we recommended excision of this area of calcifications just behind the right nipple for diagnosis.  Description of procedure: The patient is brought to the operating room placed in the supine position on the operating room table.  After an adequate level general anesthesia was obtained, her right breast was prepped with ChloraPrep and draped sterile fashion.  A timeout was taken to ensure the proper patient and proper procedure.  We infiltrated the area at the lower edge of the areola with local anesthetic.  I made a circumareolar incision around the lower edge of the areola.  We dissected superiorly behind the nipple.  I excised a 3 cm diameter area of breast tissue about 2 cm thick.  We oriented this with the paint kit.  No palpable masses were palpated.  We inspected for hemostasis.  The wound was closed with 3-0 Vicryl 4-0 Monocryl.  Benzoin Steri-Strips were applied.  The patient was extubated and brought to recovery room in stable condition.  All sponge, instrument, and needle counts are correct.  Robin Alvarado. Robin Dover, MD, [facility]  General Surgery   01/13/2022 8:13 AM

## 2022-01-13 NOTE — H&P (Signed)
Subjective    Chief Complaint: Breast Problem   [facility] - Dr. Jolayne Panther   History of Present Illness: Robin Alvarado is a 41 y.o. female who is seen today as an office consultation at the request of Dr. Sloan Leiter for evaluation of Breast Problem .   This is a 41 year old female in good health with no family history of breast cancer who presents after her initial screening mammogram revealed multiple microcalcifications just deep to the right nipple.  Attempts were made to perform a stereotactic biopsy but the calcifications are too superficial.  She is then referred to [place] for consideration of excisional biopsy.   No previous breast surgery.  No previous breast problems.  No family history of breast cancer. Review of Systems: A complete review of systems was obtained from the patient.  I have reviewed this information and discussed as appropriate with the patient.  See HPI as well for other ROS.   Review of Systems  Constitutional: Positive for weight loss.  HENT: Negative.   Eyes: Positive for blurred vision.  Respiratory: Negative.   Cardiovascular: Negative.   Gastrointestinal: Negative.   Genitourinary: Negative.   Musculoskeletal: Negative.   Skin: Negative.   Neurological: Negative.   Endo/Heme/Allergies: Negative.   Psychiatric/Behavioral: Negative.         Medical History:     Past Medical History:  Diagnosis Date   Diabetes mellitus without complication (CMS-HCC)     Hypertension           Patient Active Problem List  Diagnosis   Breast calcifications on mammogram   Hypertension   Pre-diabetes      History reviewed. No pertinent surgical history.    No Known Allergies         Current Outpatient Medications on File Prior to Visit  Medication Sig Dispense Refill   amLODIPine (NORVASC) 5 MG tablet Take 5 mg by mouth once daily       metFORMIN (GLUCOPHAGE-XR) 500 MG XR tablet Take 500 mg by mouth daily with breakfast       multivitamin tablet Take 1  tablet by mouth once daily        No current facility-administered medications on file prior to visit.           Family History  Problem Relation Age of Onset   Obesity Mother     High blood pressure (Hypertension) Father     High blood pressure (Hypertension) Brother        Social History       Tobacco Use  Smoking Status Never  Smokeless Tobacco Never      Social History        Socioeconomic History   Marital status: Married  Tobacco Use   Smoking status: Never   Smokeless tobacco: Never  Vaping Use   Vaping Use: Never used  Substance and Sexual Activity   Alcohol use: Never   Drug use: Never      Objective:         Vitals:    11/25/21 1531  BP: 130/80  Pulse: 82  SpO2: 97%  Weight: 83.6 kg (184 lb 6 oz)  Height: 162.6 cm (5\' 4" )    Body mass index is 31.65 kg/m.   Physical Exam    Constitutional:  WDWN in NAD, conversant, no obvious deformities; lying in bed comfortably Eyes:  Pupils equal, round; sclera anicteric; moist conjunctiva; no lid lag HENT:  Oral mucosa moist; good dentition  Neck:  No masses palpated,  trachea midline; no thyromegaly Lungs:  CTA bilaterally; normal respiratory effort Breasts:  symmetric, no nipple changes; no palpable masses or lymphadenopathy on either side CV:  Regular rate and rhythm; no murmurs; extremities well-perfused with no edema Abd:  +bowel sounds, soft, non-tender, no palpable organomegaly; no palpable hernias Musc:  Normal gait; no apparent clubbing or cyanosis in extremities Lymphatic:  No palpable cervical or axillary lymphadenopathy Skin:  Warm, dry; no sign of jaundice Psychiatric - alert and oriented x 4; calm mood and affect     Labs, Imaging and Diagnostic Testing: CLINICAL DATA:  41 year old female for further evaluation of RIGHT breast calcifications identified on baseline screening mammogram.   EXAM: DIGITAL DIAGNOSTIC UNILATERAL RIGHT MAMMOGRAM   TECHNIQUE: Right digital diagnostic  mammography was performed. Mammographic images were processed with CAD.   COMPARISON:  Previous exam(s).   ACR Breast Density Category c: The breast tissue is heterogeneously dense, which may obscure small masses.   FINDINGS: Full field and magnification views of the RIGHT breast demonstrates scattered calcifications within the RETROAREOLAR RIGHT breast, many which are round/punctate.   No associated mass or distortion is identified.   IMPRESSION: Likely benign RIGHT breast calcifications. Six-month follow-up recommended to ensure stability.   RECOMMENDATION: RIGHT diagnostic mammogram with magnification views in 6 months.   I have discussed the findings and recommendations with the patient through an interpreter service. If applicable, a reminder letter will be sent to the patient regarding the next appointment.   BI-RADS CATEGORY  3: Probably benign.     Electronically Signed   By: Harmon Pier M.D.   On: 05/15/2021 11:04   ADDENDUM REPORT: 11/21/2021 08:57   ADDENDUM: The patient presented for her stereotactic biopsy of the right breast calcifications. Despite multiple attempts at repositioning, the calcifications were ultimately not amenable to biopsy due to their proximity to the nipple and the skin. The patient will be referred to surgery to discuss surgical excisional biopsy of the calcifications. We will contact the patient once the appointment has been made.     Electronically Signed   By: Frederico Hamman M.D.   On: 11/21/2021 08:57   Assessment and Plan:  Diagnoses and all orders for this visit:   Microcalcification of right breast on mammogram       Due to the inability to obtain a stereotactic biopsy due to the superficial location of these microcalcifications, would recommend right breast excisional biopsy of the retroareolar region.  [name]. Corliss Skains, MD, [facility]  General Surgery   01/13/2022 7:12 AM

## 2022-01-13 NOTE — Transfer of Care (Signed)
Immediate Anesthesia Transfer of Care Note  Patient: Shatora Weatherbee  Procedure(s) Performed: RIGHT BREAST EXCISIONAL BIOPSY (Right: Breast)  Patient Location: PACU  Anesthesia Type:General  Level of Consciousness: drowsy  Airway & Oxygen Therapy: Patient Spontanous Breathing and Patient connected to face mask oxygen  Post-op Assessment: Report given to RN and Post -op Vital signs reviewed and stable  Post vital signs: Reviewed and stable  Last Vitals:  Vitals Value Taken Time  BP 101/48 01/13/22 0815  Temp    Pulse 62 01/13/22 0817  Resp 9 01/13/22 0817  SpO2 97 % 01/13/22 0817  Vitals shown include unvalidated device data.  Last Pain:  Vitals:   01/13/22 0639  TempSrc: Oral  PainSc: 0-No pain         Complications: No notable events documented.

## 2022-01-13 NOTE — Anesthesia Procedure Notes (Signed)
Procedure Name: LMA Insertion Date/Time: 01/13/2022 7:37 AM  Performed by: Alford Highland, CRNAPre-anesthesia Checklist: Patient identified, Emergency Drugs available, Suction available and Patient being monitored Patient Re-evaluated:Patient Re-evaluated prior to induction Oxygen Delivery Method: Circle System Utilized Preoxygenation: Pre-oxygenation with 100% oxygen Induction Type: IV induction Ventilation: Mask ventilation without difficulty LMA: LMA inserted LMA Size: 4.0 Number of attempts: 1 Airway Equipment and Method: Bite block Placement Confirmation: positive ETCO2 Tube secured with: Tape Dental Injury: Teeth and Oropharynx as per pre-operative assessment

## 2022-01-13 NOTE — Anesthesia Preprocedure Evaluation (Addendum)
Anesthesia Evaluation  Patient identified by MRN, date of birth, ID band Patient awake    Reviewed: Allergy & Precautions, NPO status , Patient's Chart, lab work & pertinent test results  Airway Mallampati: II  TM Distance: >3 FB Neck ROM: Full    Dental  (+) Chipped,    Pulmonary neg pulmonary ROS,    Pulmonary exam normal        Cardiovascular hypertension, Pt. on medications Normal cardiovascular exam     Neuro/Psych  Neuromuscular disease negative psych ROS   GI/Hepatic negative GI ROS, Neg liver ROS,   Endo/Other  diabetes (PRE), Oral Hypoglycemic Agents  Renal/GU negative Renal ROS     Musculoskeletal negative musculoskeletal ROS (+)   Abdominal (+) + obese,   Peds  Hematology negative hematology ROS (+)   Anesthesia Other Findings RIGHT BREAST RETROAREOLAR MICROCALCIFICATIONS  Reproductive/Obstetrics hcg negative                            Anesthesia Physical Anesthesia Plan  ASA: 2  Anesthesia Plan: General   Post-op Pain Management:    Induction: Intravenous  PONV Risk Score and Plan: 3 and Ondansetron, Dexamethasone, Midazolam and Treatment may vary due to age or medical condition  Airway Management Planned: LMA  Additional Equipment:   Intra-op Plan:   Post-operative Plan: Extubation in OR  Informed Consent: I have reviewed the patients History and Physical, chart, labs and discussed the procedure including the risks, benefits and alternatives for the proposed anesthesia with the patient or authorized representative who has indicated his/her understanding and acceptance.     Dental advisory given and Interpreter used for interveiw  Plan Discussed with: CRNA  Anesthesia Plan Comments:        Anesthesia Quick Evaluation

## 2022-01-13 NOTE — Anesthesia Postprocedure Evaluation (Signed)
Anesthesia Post Note  Patient: Robin Alvarado  Procedure(s) Performed: RIGHT BREAST EXCISIONAL BIOPSY (Right: Breast)     Patient location during evaluation: PACU Anesthesia Type: General Level of consciousness: awake Pain management: pain level controlled Vital Signs Assessment: post-procedure vital signs reviewed and stable Respiratory status: spontaneous breathing, nonlabored ventilation, respiratory function stable and patient connected to nasal cannula oxygen Cardiovascular status: blood pressure returned to baseline and stable Postop Assessment: no apparent nausea or vomiting Anesthetic complications: no   No notable events documented.  Last Vitals:  Vitals:   01/13/22 0845 01/13/22 0855  BP:  110/68  Pulse: 64 (!) 55  Resp: 14 16  Temp:  37.3 C  SpO2: 97% 100%    Last Pain:  Vitals:   01/13/22 0855  TempSrc:   PainSc: 0-No pain                 Geryl Dohn P Tabbitha Janvrin

## 2022-01-14 ENCOUNTER — Encounter (HOSPITAL_BASED_OUTPATIENT_CLINIC_OR_DEPARTMENT_OTHER): Payer: Self-pay | Admitting: Surgery

## 2022-01-14 LAB — SURGICAL PATHOLOGY

## 2022-02-02 ENCOUNTER — Inpatient Hospital Stay: Payer: Self-pay | Attending: Obstetrics and Gynecology | Admitting: *Deleted

## 2022-02-02 ENCOUNTER — Other Ambulatory Visit: Payer: Self-pay

## 2022-02-02 VITALS — BP 128/84 | Ht 64.0 in | Wt 186.6 lb

## 2022-02-02 DIAGNOSIS — Z1231 Encounter for screening mammogram for malignant neoplasm of breast: Secondary | ICD-10-CM

## 2022-02-02 DIAGNOSIS — Z0000 Encounter for general adult medical examination without abnormal findings: Secondary | ICD-10-CM

## 2022-02-02 NOTE — Progress Notes (Signed)
[facility] follow up   Interpreter: Natale Lay, [employer]  Clinical Measurement:  There were no vitals filed for this visit.    Medical History:  Patient states that she does not have high cholesterol, has high blood pressure and she has diabetes.  Medications:  Patient states that she does take medication to lower cholesterol, blood pressure and blood sugar.  Patient does not take an aspirin a day to help prevent a heart attack or stroke. During the past 7 days patient has taken prescribed medication to lower blood pressure on 4 days and blood sugar on 7 days.   Blood pressure, self measurement: Patient states that she does measure blood pressure from home. She checks her blood pressure daily. She shares her readings with a health care provider: no.   Nutrition: Patient states that on average she eats 2 cups of fruit and 3 cups of vegetables per day. Patient states that she does eat fish at least 2 times per week. Patient eats less than half servings of whole grains. Patient drinks less than 36 ounces of beverages with added sugar weekly: yes. Patient is currently watching sodium or salt intake: yes. In the past 7 days patient has had 0 drinks containing alcohol. On average patient drinks 0 drinks containing alcohol per day.      Physical activity:  Patient states that she gets 210 minutes of moderate and 210 minutes of vigorous physical activity each week.  Smoking status:  Patient states that she has has never smoked .   Quality of life:  Over the past 2 weeks patient states that she had little interest or pleasure in doing things: not at all. She has been feeling down, depressed or hopeless:not at all.    Risk reduction and counseling:   Health Coaching:  Spoke with patient about medication adherence in regards to BP medication. Patient states that she forgets to take BP meds everyday but does remember to take blood sugar medication daily. Encouraged patient to set a daily alarm on her  phone to remind her to take her medication. Spoke with patient about the importance of taking medication daily and at the same time everyday. Patient is doing well with diet and exercise.    Navigation: This was the  follow up session for this patient, I will check up on her progress in the coming months.  Time: 25 minutes

## 2022-02-08 NOTE — Progress Notes (Signed)
Subjective:  CC: Hypertension, prediabetes  HPI:  [name] is a 41 y.o. female with a past medical history stated below and presents today for hypertension and prediabetes.  She did undergo breast biopsy in June after mammogram showed right breast calcifications.  Biopsy results were benign.. Please see problem based assessment and plan for additional details.  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 30 tablet 11   ibuprofen (ADVIL,MOTRIN) 600 MG tablet Take 1 tablet (600 mg total) by mouth every 6 (six) hours. 30 tablet 0   metFORMIN (GLUCOPHAGE XR) 500 MG 24 hr tablet Take 1 tablet (500 mg total) by mouth daily with breakfast. 30 tablet 11   No current facility-administered medications on file prior to visit.    Family History  Problem Relation Age of Onset   Hypertension Father    Hypertension Brother    Prostate cancer Maternal Grandfather        maternal   Diabetes Paternal Uncle    Anesthesia problems Neg Hx    Other Neg Hx    Breast cancer Neg Hx     Social History   Socioeconomic History   Marital status: Married    Spouse name: Not on file   Number of children: Not on file   Years of education:  Not on file   Highest education level: Not on file  Occupational History   Not on file  Tobacco Use   Smoking status: Never   Smokeless tobacco: Never  Vaping Use   Vaping Use: Never used  Substance and Sexual Activity   Alcohol use: No   Drug use: Never   Sexual activity: Yes    Birth control/protection: Condom  Other Topics Concern   Not on file  Social History Narrative   Not on file   Social Determinants of Health   Financial Resource Strain: Not on file  Food Insecurity: No Food Insecurity (02/02/2022)   Hunger Vital Sign    Worried About Running Out of Food in the Last Year: Never true    Ran Out of Food in the Last Year: Never true  Transportation Needs: No Transportation Needs (02/02/2022)   PRAPARE - Administrator, Civil Service (Medical): No    Lack of Transportation (Non-Medical): No  Physical Activity: Not on file  Stress: Not on file  Social Connections: Not on file  Intimate Partner Violence: Not on file    Review of Systems: ROS negative except for what is noted on the assessment and plan.  Objective:   Vitals:   02/09/22 1615  BP: 113/65  Pulse: 62  Temp: 98.1 F (36.7 C)  TempSrc: Oral  SpO2: 100%  Weight: 181 lb 12.8 oz (82.5 kg)  Height: 5\' 4"  (  1.626 m)    Physical Exam: Constitutional: well-appearing, in no acute distress Cardiovascular: regular rate and rhythm, no m/r/g Pulmonary/Chest: normal work of breathing on room air, lungs clear to auscultation bilaterally Abdominal: soft, non-tender, non-distended MSK: normal bulk and tone Skin: warm and dry    Assessment & Plan:  Hypertension Patient presents for follow-up on hypertension. She checks her blood pressure once a day. Blood pressure log reviewed with patient and showed average systolic between 161-096/ 70s. She denies side effects of medication. A/P: Continue amlodipine 5 mg qd  Pre-diabetes Patient with history of prediabetes.  Last hemoglobin A1c of 6.2 Tober  2022.  Labs are completed through [employer].  She has continued to take metformin 500 mg with breakfast.  She denies nausea or diarrhea with metformin. A/P: Repeat hemoglobin A1c in October.   Patient discussed with Dr. Laney Pastor Robin Alvarado, D.O. [facility]  PGY-2 Pager: [phone]  Phone: [phone] Date 02/09/2022  Time 6:22 PM

## 2022-02-09 ENCOUNTER — Ambulatory Visit: Payer: Self-pay | Admitting: Internal Medicine

## 2022-02-09 VITALS — BP 113/65 | HR 62 | Temp 98.1°F | Ht 64.0 in | Wt 181.8 lb

## 2022-02-09 DIAGNOSIS — R7303 Prediabetes: Secondary | ICD-10-CM

## 2022-02-09 DIAGNOSIS — I10 Essential (primary) hypertension: Secondary | ICD-10-CM

## 2022-02-09 DIAGNOSIS — R921 Mammographic calcification found on diagnostic imaging of breast: Secondary | ICD-10-CM

## 2022-02-09 NOTE — Assessment & Plan Note (Signed)
Patient presents for follow-up on hypertension. She checks her blood pressure once a day. Blood pressure log reviewed with patient and showed average systolic between 080-223/ 70s. She denies side effects of medication. A/P: Continue amlodipine 5 mg qd

## 2022-02-09 NOTE — Patient Instructions (Signed)
Thank you, Ms.Airiana Mardella Nuckles for allowing [place] to provide your care today. Today we discussed:  Blood pressure: Please continue taking amlodipine 5 mg daily. Your blood pressure looks great!  Pre-diabetes: Continue metformin and we will check blood work in October.    I have ordered the following labs for you:  Lab Orders  No laboratory test(s) ordered today      Referrals ordered today:   Referral Orders  No referral(s) requested today     I have ordered the following medication/changed the following medications:   Stop the following medications: There are no discontinued medications.   Start the following medications: No orders of the defined types were placed in this encounter.    Follow up: 3 months   We look forward to seeing you next time. Please call our clinic at [phone] if you have any questions or concerns. The best time to call is Monday-Friday from 9am-4pm, but there is someone available 24/7. If after hours or the weekend, call the main hospital number and ask for the Internal Medicine Resident On-Call. If you need medication refills, please notify your pharmacy one week in advance and they will send [place] a request.   Thank you for trusting me with your care. Wishing you the best!   Rudene Christians, DO [facility]

## 2022-02-09 NOTE — Assessment & Plan Note (Signed)
Biopsy of right breast calcification showed fibrocystic changes consistent with recurrent metaplasia.  Results were reviewed with the patient in June via phone.

## 2022-02-09 NOTE — Assessment & Plan Note (Signed)
Patient with history of prediabetes.  Last hemoglobin A1c of 6.2 Tober 2022.  Labs are completed through [employer].  She has continued to take metformin 500 mg with breakfast.  She denies nausea or diarrhea with metformin. A/P: Repeat hemoglobin A1c in October.

## 2022-02-11 NOTE — Progress Notes (Signed)
Internal Medicine Clinic Attending  Case discussed with the resident at the time of the visit.  We reviewed the resident's history and exam and pertinent patient test results.  I agree with the assessment, diagnosis, and plan of care documented in the resident's note.  

## 2022-04-28 ENCOUNTER — Ambulatory Visit: Payer: Self-pay | Admitting: *Deleted

## 2022-04-28 ENCOUNTER — Ambulatory Visit
Admission: RE | Admit: 2022-04-28 | Discharge: 2022-04-28 | Disposition: A | Discharge status: Home / Self Care | Payer: No Typology Code available for payment source | Source: Ambulatory Visit | Attending: Obstetrics and Gynecology | Admitting: Obstetrics and Gynecology

## 2022-04-28 VITALS — BP 130/98 | Wt 178.9 lb

## 2022-04-28 DIAGNOSIS — Z1231 Encounter for screening mammogram for malignant neoplasm of breast: Secondary | ICD-10-CM

## 2022-04-28 DIAGNOSIS — Z01419 Encounter for gynecological examination (general) (routine) without abnormal findings: Secondary | ICD-10-CM

## 2022-04-28 NOTE — Patient Instructions (Signed)
Explained breast self awareness with Aniceto Boss. Pap smear completed today. Let her know that her next Pap smear will be due based on the result of today's Pap smear. Referred patient to the [place] for a screening mammogram on mobile unit per recommendation. Appointment scheduled Tuesday, April 28, 2022 at 1530. Patient aware of appointment and will be there. Let patient know will follow up with her within the next couple weeks with results of her Pap smear by phone. Informed patient that the [facility] will follow up with her within the next couple of weeks with results of her mammogram by letter or phone. Avin Upperman verbalized understanding.  Maxx Pham, Arvil Chaco, RN 3:16 PM

## 2022-04-28 NOTE — Progress Notes (Signed)
[name] is a 41 y.o. [phone] female who presents to [facility] clinic today with complaint of right breast tenderness at surgical site that comes and goes. Right breast excision was completed 01/13/2022 showing fibrocystic changes with apocrine metaplasia, sclerosing adenosis, and rare microcalcification's. No in situ or invasive carcinoma seen. Patient rates the pain at a 2-3 out of 10. Patient stated her surgeon is aware and told her it should resolve in 80-months.    Pap Smear: Pap smear completed today. Last Pap smear was 12/04/2020 at the [facility] clinic and was normal with negative HPV. Per patient has no history of an abnormal Pap smear. Patient stated she has a history of a Pap smear in April or May 2021 that was normal with positive HPV. Last Pap smear result is not available in Epic. Last Pap smear result will be scanned into Epic.   Physical exam: Breasts Breasts symmetrical. No skin abnormalities bilateral breasts. No nipple retraction bilateral breasts. No nipple discharge bilateral breasts. No lymphadenopathy. No lumps palpated bilateral breasts. Complaints of right nipple area tenderness on exam.  MS DIGITAL DIAG TOMO UNI RIGHT  Addendum Date: 11/26/2021   ADDENDUM REPORT: 11/26/2021 09:55 ADDENDUM: Surgical consultation has been arranged with Dr. Donnie Mesa at [facility] on Nov 25, 2021. [name] RN on 11/26/2021 Electronically Signed   By: Ammie Ferrier M.D.   On: 11/26/2021 09:55   Addendum Date: 11/21/2021   ADDENDUM REPORT: 11/21/2021 08:57 ADDENDUM: The patient presented for her stereotactic biopsy of the right breast calcifications. Despite multiple attempts at repositioning, the calcifications were ultimately not amenable to biopsy due to their proximity to the nipple and the skin. The patient will be referred to surgery to discuss surgical excisional biopsy of the calcifications. We will contact the patient once the  appointment has been made. Electronically Signed   By: Ammie Ferrier M.D.   On: 11/21/2021 08:57   Result Date: 11/26/2021 CLINICAL DATA:  First six-month follow-up for RIGHT breast calcifications. EXAM: DIGITAL DIAGNOSTIC UNILATERAL RIGHT MAMMOGRAM WITH TOMOSYNTHESIS AND CAD TECHNIQUE: Right digital diagnostic mammography and breast tomosynthesis was performed. The images were evaluated with computer-aided detection. COMPARISON:  Previous exam(s). ACR Breast Density Category c: The breast tissue is heterogeneously dense, which may obscure small masses. FINDINGS: Magnified views are performed of calcifications in the anterior UPPER central RIGHT breast. These views demonstrate loosely grouped round and oval calcifications, some with linear distribution. Vascular calcifications are also present. Calcifications span 2.3 x 0.9 x 3 5 centimeters. There is no associated mass or distortion. IMPRESSION: Indeterminate RIGHT breast calcifications. RECOMMENDATION: Stereotactic biopsy of RIGHT breast calcifications. I have discussed the findings and recommendations with the patient. If applicable, a reminder letter will be sent to the patient regarding the next appointment. BI-RADS CATEGORY  4: Suspicious. Electronically Signed: By: Nolon Nations M.D. On: 11/17/2021 14:31  MS DIGITAL DIAG UNI RIGHT  Result Date: 05/15/2021 CLINICAL DATA:  41 year old female for further evaluation of RIGHT breast calcifications identified on baseline screening mammogram. EXAM: DIGITAL DIAGNOSTIC UNILATERAL RIGHT MAMMOGRAM TECHNIQUE: Right digital diagnostic mammography was performed. Mammographic images were processed with CAD. COMPARISON:  Previous exam(s). ACR Breast Density Category c: The breast tissue is heterogeneously dense, which may obscure small masses. FINDINGS: Full field and magnification views of the RIGHT breast demonstrates scattered calcifications within the RETROAREOLAR RIGHT breast, many which are round/punctate.  No associated mass or distortion is identified. IMPRESSION: Likely benign RIGHT breast calcifications. Six-month follow-up recommended to  ensure stability. RECOMMENDATION: RIGHT diagnostic mammogram with magnification views in 6 months. I have discussed the findings and recommendations with the patient through an interpreter service. If applicable, a reminder letter will be sent to the patient regarding the next appointment. BI-RADS CATEGORY  3: Probably benign. Electronically Signed   By: Margarette Canada M.D.   On: 05/15/2021 11:04  MM 3D SCREEN BREAST BILATERAL  Result Date: 05/06/2021 CLINICAL DATA:  Screening. Baseline. EXAM: DIGITAL SCREENING BILATERAL MAMMOGRAM WITH TOMOSYNTHESIS AND CAD TECHNIQUE: Bilateral screening digital craniocaudal and mediolateral oblique mammograms were obtained. Bilateral screening digital breast tomosynthesis was performed. The images were evaluated with computer-aided detection. COMPARISON:  None. ACR Breast Density Category c: The breast tissue is heterogeneously dense, which may obscure small masses. FINDINGS: In the right breast, calcifications warrant further evaluation with magnified views. These calcifications are seen within the upper subareolar RIGHT breast. In the left breast, no findings suspicious for malignancy. IMPRESSION: Further evaluation is suggested for calcifications in the right breast. RECOMMENDATION: Diagnostic mammogram of the right breast. (Code:[id number]) The patient will be contacted regarding the findings, and additional imaging will be scheduled. BI-RADS CATEGORY  0: Incomplete. Need additional imaging evaluation and/or prior mammograms for comparison. Electronically Signed   By: Franki Cabot M.D.   On: 05/06/2021 08:25       Pelvic/Bimanual Ext Genitalia No lesions, no swelling and no discharge observed on external genitalia.        Vagina Vagina pink and normal texture. No lesions or discharge observed in vagina.        Cervix Cervix is  present. Cervix pink and of normal texture. Cervix friable. No discharge observed.    Uterus Uterus is present and palpable. Uterus in normal position and normal size.        Adnexae Bilateral ovaries present and palpable. No tenderness on palpation.         Rectovaginal No rectal exam completed today since patient had no rectal complaints. No skin abnormalities observed on exam.     Smoking History: Patient has never smoked.   Patient Navigation: Patient education provided. Access to services provided for patient through [place] program. Spanish interpreter Rudene Anda from [facility] provided.   Breast and Cervical Cancer Risk Assessment: Patient does not have family history of breast cancer, known genetic mutations, or radiation treatment to the chest before age 68. Patient does not have history of cervical dysplasia, immunocompromised, or DES exposure in-utero.    Risk Assessment     Risk Scores       04/28/2022 04/15/2021   Last edited by: Royston Bake, CMA Royston Bake, CMA   5-year risk: 0.5 % 0.4 %   Lifetime risk: 8.8 % 8.9 %            A: BCCCP exam with pap smear Complaint of right breast tenderness at surgical site.  P: Referred patient to the [facility] for a screening mammogram on mobile unit per recommendation. Appointment scheduled Tuesday, April 28, 2022 at 1530.  Loletta Parish, RN 04/28/2022 3:16 PM

## 2022-05-05 LAB — CYTOLOGY - PAP
Comment: NEGATIVE
Diagnosis: NEGATIVE
High risk HPV: NEGATIVE

## 2022-05-06 ENCOUNTER — Telehealth: Payer: Self-pay

## 2022-05-06 NOTE — Telephone Encounter (Signed)
Left message via Genevieve Norlander, [facility] for patient to call back for lab results. Left name and number for patient to call back.

## 2022-05-07 ENCOUNTER — Telehealth: Payer: Self-pay

## 2022-05-07 NOTE — Telephone Encounter (Signed)
Called patient via [place] to give pap smear results. Informed patient that pap smear was normal and HPV was negative. Based on this result her next pap smear will be due in 3 years. Patient voiced understanding.

## 2022-06-01 ENCOUNTER — Other Ambulatory Visit: Payer: Self-pay

## 2022-06-01 ENCOUNTER — Ambulatory Visit: Payer: Self-pay

## 2022-10-05 ENCOUNTER — Other Ambulatory Visit: Payer: Self-pay

## 2022-10-05 ENCOUNTER — Inpatient Hospital Stay: Payer: No Typology Code available for payment source | Attending: Obstetrics and Gynecology | Admitting: *Deleted

## 2022-10-05 VITALS — BP 122/84 | Ht 64.0 in | Wt 181.8 lb

## 2022-10-05 DIAGNOSIS — Z0000 Encounter for general adult medical examination without abnormal findings: Secondary | ICD-10-CM

## 2022-10-05 NOTE — Progress Notes (Signed)
[facility] initial Robin Alvarado, [employer]   Clinical Measurement: There were no vitals filed for this visit. Fasting Labs Drawn Today, will review with patient when they result.   Medical History: Patient states that she does not have high cholesterol, has high blood pressure and she has diabetes.  Medications: Patient states that she does take medication to lower cholesterol, blood pressure or blood sugar.  Patient does not take an aspirin a day to help prevent a heart attack or stroke. During the past 7 days patient has taken prescribed medication to lower blood pressure and blood sugar on 7 days.   Blood pressure, self measurement: Patient states that she does not measure blood pressure from home. She checks her blood pressure N/A. She shares her readings with a health care provider: N/A.   Nutrition: Patient states that on average she eats 3 cups of fruit and 3 cups of vegetables per day. Patient states that she does eat fish at least 2 times per week. Patient eats less than half servings of whole grains. Patient drinks less than 36 ounces of beverages with added sugar weekly: yes. Patient is currently watching sodium or salt intake: yes. In the past 7 days patient has consumed drinks containing alcohol on 0 days. On a day that patient consumes drinks containing alcohol on average 0 drinks are consumed.      Physical activity: Patient states that she gets 60 minutes of moderate and 60 minutes of vigorous physical activity each week.  Smoking status: Patient states that she has has never smoked .   Quality of life: Over the past 2 weeks patient states that she had little interest or pleasure in doing things: not at all. She has been feeling down, depressed or hopeless:not at all.   Social Determinants of Health Assessment:   Computer Use: During the last 12 months patient states that she has used any of the following: desktop/laptop, smart phone or tablet/other  portable wireless computer: yes.   Internet Use: During the last 12 months, did you or any member of your household have access to the internet: Yes, by paying a cell phone company or internet service provider.   Food Insecurities: During the last 12 months, where there any times when you were worried that you would run out of food because of a lack of money or other resources: No.   Transportation Barriers: During the last 12 months, have you missed a doctor's appointment because of transportation problems: No.   Childcare Barriers: If you are currently using childcare services, please identify  the type of services you use. (If not using childcare services, please select "Not applicable"): not applicable. During the last 12 months, have you had any barriers to childcare services such as: not applicable.   Housing: What is your housing situation today: I have housing.   Intimate Partner Violence: During the last 12 months, how often did your partner physically hurt you: never. During the last 12 months, how often did your partner insult you or talk down to you: never.  Medication Adherence: During the last 12 months, did you ever forget to take your medicine: Yes. During the last 12 months, were you careless ar times about taking your medicine: No. During the last 12 months, when you felt better did you sometimes stop taking your medication: Yes. During the last 12 months, sometimes if you felt worse when you took your medicine did you stop taking it: No.   Risk reduction  and counseling:   Health Coaching: Patient is eating a variety of fruits and vegetables daily. Patient states that on average she consumes 3 cups of both fruits and vegetables daily. Patient has been consuming two servings of fish weekly. Patient does nor consume whole grains regularly. Patient states that she has cut back on the amount of carbs that she consumes due to her elevated blood sugar. Encouraged patient to try whole  grains when she does consume carbs. Gave the example of substituting brown rice in the place of white rice. Patient does not consume beverages with added sugars regularly. Patient states that she has been watching her sodium intake. Patient has been doing zumba classes on average twice a week. Patient has set a goal for increased exercise (see below). Offered to refer patient for assistance with medication adherence. Patient declined referral. Gave patient the suggestion of setting a daily reminder alarm on her phone for help with remembering to take medication daily and at the same time. Patient did state that over the past year she has skipped her medications because she feels well. Reminded patient that it is important to take her medications every day even when she feels well. Reinforced that she is probably feeling well because the medications are working and doing their job. Instructed her to follow her PCP's recommendation in regards to stopping a medication. Patient voiced understanding.   Goals: Patient would like to work on increasing the amount of exercise that she does each week. Patient would like to increase exercise to 3 (1 hour) zumba classes each week. Patient would like to work on reaching this goal over the next month.    Navigation:  I will notify patient of lab results.  Patient is aware of 2 more health coaching sessions and a follow up.  Time: 25 minutes

## 2022-10-06 LAB — LIPID PANEL
Chol/HDL Ratio: 3.3 ratio (ref 0.0–4.4)
Cholesterol, Total: 173 mg/dL (ref 100–199)
HDL: 52 mg/dL (ref >39)
LDL Chol Calc (NIH): 105 mg/dL — ABNORMAL HIGH (ref 0–99)
Triglycerides: 89 mg/dL (ref 0–149)
VLDL Cholesterol Cal: 16 mg/dL (ref 5–40)

## 2022-10-06 LAB — GLUCOSE, RANDOM: Glucose: 95 mg/dL (ref 70–99)

## 2022-10-06 LAB — HEMOGLOBIN A1C
Est. average glucose Bld gHb Est-mCnc: 117 mg/dL
Hgb A1c MFr Bld: 5.7 % — ABNORMAL HIGH (ref 4.8–5.6)

## 2022-10-12 ENCOUNTER — Telehealth: Payer: Self-pay

## 2022-10-12 NOTE — Telephone Encounter (Signed)
Left message for patient via Erika McReynolds, [employer] about lab results from [facility]. Left name and number for patient to call back.   

## 2022-10-12 NOTE — Telephone Encounter (Signed)
Health coaching 2   interpreter- [place] [phone]   Labs- 173 cholesterol, 105 LDL cholesterol, 89 triglycerides, 52 HDL cholesterol, 5.7 hemoglobin A1C, 95 mean plasma glucose.  Patient understands and is aware of her lab results.   Goals-  Spoke with patient about continuing to watch the amount of fried and fatty foods consumed. Encouraged patient to also try adding in another 1-2 days of exercise per week. Glucose and HbA1C has improved from past screenings. Encouraged patient to continue taking her metformin unless otherwise instructed by her PCP.   Navigation:  Patient is aware of 1 more health coaching sessions and a follow up. Will refer patient back to Internal Medicine for FU w/ PCP.  Time- 13 minutes

## 2022-10-20 ENCOUNTER — Ambulatory Visit: Payer: No Typology Code available for payment source | Admitting: Student

## 2022-10-29 ENCOUNTER — Ambulatory Visit (INDEPENDENT_AMBULATORY_CARE_PROVIDER_SITE_OTHER): Payer: Self-pay | Admitting: Student

## 2022-10-29 DIAGNOSIS — I10 Essential (primary) hypertension: Secondary | ICD-10-CM

## 2022-10-29 DIAGNOSIS — Z7984 Long term (current) use of oral hypoglycemic drugs: Secondary | ICD-10-CM

## 2022-10-29 DIAGNOSIS — R7303 Prediabetes: Secondary | ICD-10-CM

## 2022-10-29 MED ORDER — AMLODIPINE BESYLATE 5 MG PO TABS: 5.0000 mg | ORAL_TABLET | Freq: Every day | ORAL | 2 refills | Status: DC

## 2022-10-29 MED ORDER — METFORMIN HCL ER 500 MG PO TB24: 500.0000 mg | ORAL_TABLET | Freq: Every day | ORAL | 2 refills | Status: DC

## 2022-10-29 NOTE — Assessment & Plan Note (Signed)
Prescribed amlodipine 5 mg daily for her essential hypertension. She has been out of her medications for two days and her blood pressure today is 121/55. She has a brachial cuff at home and we discussed monitoring her blood pressures frequently. She endorses periodic episodes of lightheadedness/dizziness when leaving forward to pick things up.   With her having blood pressures at goal not on amlodipine will have her continue to hold her amlodipine and restart if blood pressures persistently elevated above 130/80.

## 2022-10-29 NOTE — Assessment & Plan Note (Addendum)
Doing well with metformin 500 mg daily. Discussed exercise and healthy diet including low carbohydrate foods. She has not been able to exercise recently as she is busy with her children.   Follow up A1c in 3-6 months and continue metformin daily.

## 2022-10-29 NOTE — Progress Notes (Signed)
   CC: follow up HTN and pre-diabetes  HPI:  [name] is a 42 y.o. female living with a history stated below and presents today for follow up of her hypertension and pre-diabetes. Please see problem based assessment and plan for additional details. Spanish interpretor used during this encounter.   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 (Patient not taking: Reported on 10/05/2022) 30 tablet 0   No current facility-administered medications on file prior to visit.    Review of Systems: ROS negative except for what is noted on the assessment and plan.  Vitals:   10/29/22 1602  BP: (!) 121/55  Pulse: 71  Temp: 98.5 F (36.9 C)  TempSrc: Oral  SpO2: 100%  Weight: 187 lb 4.8 oz (85 kg)  Height: 5\' 6"  (1.676 m)    Physical Exam: Constitutional: well-appearing, in no acute distress HENT: normocephalic atraumatic, Eyes: conjunctiva non-erythematous Neck: supple Cardiovascular: regular rate and rhythm, no m/r/g Pulmonary/Chest: normal work of breathing on room air MSK: normal bulk and tone Neurological: alert & oriented x 3 Skin: warm and dry Psych: normal mood  Assessment & Plan:    Hypertension Prescribed amlodipine 5 mg daily for her essential hypertension. She has been out of her medications for two days and her blood pressure today is 121/55. She has a brachial cuff at home and we discussed monitoring her blood pressures frequently. She endorses periodic episodes of lightheadedness/dizziness when leaving forward to pick things up.   With her having blood pressures at goal not on amlodipine will have her continue to hold her amlodipine and restart if blood pressures persistently elevated above 130/80.   Pre-diabetes Doing well with metformin 500 mg daily. Discussed exercise and healthy diet including low carbohydrate foods. She has not been able to exercise recently as she is busy with her children.   Follow up A1c in 3-6 months and continue metformin daily.  Patient discussed with Dr. Butch Penny, D.O. [facility], PGY-3 Phone: [phone] Date 10/29/2022 Time 10:24 PM

## 2022-10-29 NOTE — Patient Instructions (Addendum)
[name], Robin Alvarado por permitirnos brindarle atencin hoy. hoy discutimos  Blood pressure Moniotr your blood pressure every other day or so. Our goal is below 130/90. IF your blood pressures stay below this, do not take your amloidpine. If they are higher than this, restart taking it daily  Pre-diabetes Continue to take your metformin daily. Work on exercising daily and decreasing soda and carbohydrates like rice, pasta   Presin arterial Controle su presin arterial aproximadamente [name]. Nuestro objetivo est por debajo de 130/90. SI su presin arterial se mantiene por debajo de esto, no tome amloidpino. Si son superiores a esto, reinicie su toma diaria.  prediabetes Contine tomando metformina diariamente. Esfurcese por hacer ejercicio a diario y [employer] refrescos y los carbohidratos como arroz y pasta.   He ordenado los siguientes laboratorios para usted:  Lab Orders  No laboratory test(s) ordered today     Pruebas ordenadas hoy:  Referencias ordenadas hoy:  Referral Orders  No referral(s) requested today     He ordenado el siguiente medicamento/cambiado los siguientes medicamentos:  Suspender los siguientes medicamentos: Medications Discontinued During This Encounter  Medication Reason   metFORMIN (GLUCOPHAGE XR) 500 MG 24 hr tablet Reorder   amLODipine (NORVASC) 5 MG tablet Reorder     Iniciar los siguientes medicamentos: Meds ordered this encounter  Medications   amLODipine (NORVASC) 5 MG tablet    Sig: Take 1 tablet (5 mg total) by mouth daily.    Dispense:  90 tablet    Refill:  2   metFORMIN (GLUCOPHAGE XR) 500 MG 24 hr tablet    Sig: Take 1 tablet (500 mg total) by mouth daily with breakfast.    Dispense:  90 tablet    Refill:  2     Seguimiento 6 months      Si tiene [name] pregunta o inquietud, llame a la clnica de medicina interna al [phone].     Sanjuana Letters, D.O. [place]

## 2022-10-30 NOTE — Progress Notes (Signed)
Internal Medicine Clinic Attending  Case discussed with Dr. Katsadouros  At the time of the visit.  We reviewed the resident's history and exam and pertinent patient test results.  I agree with the assessment, diagnosis, and plan of care documented in the resident's note.  

## 2023-06-09 ENCOUNTER — Telehealth: Payer: Self-pay

## 2023-06-09 NOTE — Telephone Encounter (Signed)
Health Coaching 3  interpreter- Natale Lay, [facility]   Goals- Patient has been working on increasing her exercising. She has been doing zumba classes 2-3 days per week for 1 hour.    New goal- Patient will continue working on her fitness.    Navigation:  Patient is aware of  a follow up session. Patient is scheduled for FU on 07/12/23.   Time- 10 minutes

## 2023-07-12 ENCOUNTER — Inpatient Hospital Stay: Payer: No Typology Code available for payment source | Attending: Obstetrics and Gynecology | Admitting: *Deleted

## 2023-07-12 ENCOUNTER — Other Ambulatory Visit: Payer: Self-pay

## 2023-07-12 VITALS — BP 112/80 | Ht 64.0 in | Wt 192.4 lb

## 2023-07-12 DIAGNOSIS — Z1231 Encounter for screening mammogram for malignant neoplasm of breast: Secondary | ICD-10-CM

## 2023-07-12 DIAGNOSIS — Z0000 Encounter for general adult medical examination without abnormal findings: Secondary | ICD-10-CM

## 2023-07-12 NOTE — Progress Notes (Signed)
[facility] follow up   Interpreter: Natale Lay, Haroldine Laws  Clinical Measurement:   Vitals:   07/12/23 1028 07/12/23 1048  BP: 108/78 112/80      Medical History: Patient states that she does not have high cholesterol, does not have high blood pressure and she has diabetes. Patient states that she  unknown  history of gestational hypertension,  unknown  history of pre-eclampsia/eclampsia and she  unknown  history of gestational diabetes.     Medications: Patient states that she does take medication to lower cholesterol, blood pressure and blood sugar.  Patient does not take an aspirin a day to help prevent a heart attack or stroke. During the past 7 days patient has taken prescribed medication to lower blood sugar on 7 days.   Blood pressure, self measurement: Patient states that she does measure blood pressure from home. She checks her blood pressure a few times per week. She shares her readings with a health care provider: yes.   Nutrition: Patient states that on average she eats 1 cups of fruit and 1 cups of vegetables per day. Patient states that she does eat fish at least 2 times per week. Patient eats about half servings of whole grains. Patient drinks less than 36 ounces of beverages with added sugar weekly: yes. Patient is currently watching sodium or salt intake: yes. In the past 7 days patient has had 0 drinks containing alcohol. On average patient drinks 0 drinks containing alcohol per day.      Physical activity: Patient states that she gets 0 minutes of moderate and 0 minutes of vigorous physical activity each week.  Smoking status: Patient states that she has has never smoked .   Quality of life: Over the past 2 weeks patient states that she had little interest or pleasure in doing things: several days. She has been feeling down, depressed or hopeless:several days.   Social Determinants of Health Assessment:   Computer Use: During the last 12 months patient states that she has  used any of the following: desktop/laptop, smart phone or tablet/other portable wireless computer: yes.   Internet Use: During the last 12 months, did you or any member of your household have access to the internet: Yes, by paying a cell phone company or internet service provider.   Food Insecurities: During the last 12 months, where there any times when you were worried that you would run out of food because of a lack of money or other resources: No.   Transportation Barriers: During the last 12 months, have you missed a doctor's appointment because of transportation problems: No.   Childcare Barriers: If you are currently using childcare services, please identify  the type of services you use. (If not using childcare services, please select "Not applicable"): not applicable. During the last 12 months, have you had any barriers to childcare services such as: not applicable.   Housing: What is your housing situation today: I have housing.   Intimate Partner Violence: During the last 12 months, how often did your partner physically hurt you: never. During the last 12 months, how often did your partner insult you or talk down to you: never.  Medication Adherence: During the last 12 months, did you ever forget to take your medicine: No. During the last 12 months, were you careless ar times about taking your medicine: No. During the last 12 months, when you felt better did you sometimes stop taking your medication: No. During the last 12 months, sometimes if you  felt worse when you took your medicine did you stop taking it: No.    Risk reduction and counseling:   Health Coaching: Spoke with patient about the daily recommendations for fruits and vegetables. Encouraged patient to try and increase her intake. Patient has been consuming at least two servings of fish weekly. Patient consumes some whole grains. Patient has not been drinking beverages sweetened with sugar. Patient has not been able to  exercise recently due to an injury with her leg. Patient hopes to be able to start exercising again soon.      Navigation: This was the  follow up session for this patient, I will check up on her progress in the coming months. Referred patient for counseling services through [id number].  Time: 25 minutes

## 2023-08-05 ENCOUNTER — Ambulatory Visit
Admission: RE | Admit: 2023-08-05 | Discharge: 2023-08-05 | Disposition: A | Discharge status: Home / Self Care | Payer: No Typology Code available for payment source | Source: Ambulatory Visit | Attending: Obstetrics and Gynecology | Admitting: Obstetrics and Gynecology

## 2023-08-05 DIAGNOSIS — Z1231 Encounter for screening mammogram for malignant neoplasm of breast: Secondary | ICD-10-CM

## 2023-09-29 LAB — CYTOLOGY - PAP: Pap: NEGATIVE

## 2023-10-28 ENCOUNTER — Telehealth: Payer: Self-pay | Admitting: *Deleted

## 2023-10-28 NOTE — Telephone Encounter (Signed)
 Copied from CRM [id number]. Topic: Appointments - Appointment Scheduling >> Oct 28, 2023  1:23 PM Tiffany H wrote: Patient/patient representative is calling to schedule an appointment. Refer to attachments for appointment information.   Patient is treating pre-diabetes. Patient's last labs were drawn in April of 2024. Please submit new orders for A1C check.

## 2023-10-28 NOTE — Telephone Encounter (Signed)
 Pt has an appt 4/23 @ 0845Am. Pt was called via [employer] (Spanish), [place] #[phone] - pt was informed of appt.

## 2023-10-28 NOTE — Telephone Encounter (Signed)
 Patient requesting appointment

## 2023-10-30 NOTE — Progress Notes (Signed)
Declined SDOH.

## 2023-11-05 ENCOUNTER — Telehealth: Payer: Self-pay

## 2023-11-05 NOTE — Telephone Encounter (Signed)
 Telephoned patient at mobile number using interpreter, Robin Alvarado. [facility] pink card will not pay for follow up appointments. Patient will contact office for payment options.

## 2023-11-08 ENCOUNTER — Telehealth: Payer: Self-pay | Admitting: Internal Medicine

## 2023-11-08 NOTE — Telephone Encounter (Signed)
 Copied from CRM [id number]. Topic: General - Billing Inquiry >> Nov 05, 2023 12:38 PM Corin V wrote: Reason for CRM: Patient no longer has insurance or her payment assistance card. She is wanting to get an estimate of the cost for the visit on 11/17/23 including how much any possible labs would be. She said that she has to get her A1C checked and possibly other labs and would like to make sure she can afford this before attending the appointment. Please use an interpreter when calling patient back at [phone].   Please refer to message above.  Patient contacted via telephone with [employer] rep Ever ID # [id number].  No answer, but he was able to leave detailed message.  Message left was unable to give exact price of office visit on 11/17/2023, but we do not know what the doctor will be doing that day.  She can bring a $25 copay, be billed for the remaining balance, and pick up applications for financial assistance.

## 2023-11-17 ENCOUNTER — Ambulatory Visit (INDEPENDENT_AMBULATORY_CARE_PROVIDER_SITE_OTHER): Payer: Self-pay | Admitting: Student

## 2023-11-17 ENCOUNTER — Other Ambulatory Visit: Payer: Self-pay

## 2023-11-17 ENCOUNTER — Encounter: Payer: Self-pay | Admitting: Student

## 2023-11-17 VITALS — BP 123/71 | HR 71 | Temp 97.9°F | Ht 64.0 in | Wt 199.0 lb

## 2023-11-17 DIAGNOSIS — Z139 Encounter for screening, unspecified: Secondary | ICD-10-CM

## 2023-11-17 DIAGNOSIS — N920 Excessive and frequent menstruation with regular cycle: Secondary | ICD-10-CM | POA: Insufficient documentation

## 2023-11-17 DIAGNOSIS — I10 Essential (primary) hypertension: Secondary | ICD-10-CM

## 2023-11-17 DIAGNOSIS — Z13220 Encounter for screening for lipoid disorders: Secondary | ICD-10-CM

## 2023-11-17 DIAGNOSIS — Z7689 Persons encountering health services in other specified circumstances: Secondary | ICD-10-CM | POA: Insufficient documentation

## 2023-11-17 DIAGNOSIS — Z1231 Encounter for screening mammogram for malignant neoplasm of breast: Secondary | ICD-10-CM

## 2023-11-17 DIAGNOSIS — R7303 Prediabetes: Secondary | ICD-10-CM

## 2023-11-17 DIAGNOSIS — R921 Mammographic calcification found on diagnostic imaging of breast: Secondary | ICD-10-CM

## 2023-11-17 DIAGNOSIS — N921 Excessive and frequent menstruation with irregular cycle: Secondary | ICD-10-CM

## 2023-11-17 HISTORY — DX: Persons encountering health services in other specified circumstances: Z76.89

## 2023-11-17 HISTORY — DX: Excessive and frequent menstruation with regular cycle: N92.0

## 2023-11-17 HISTORY — DX: Encounter for screening, unspecified: Z13.9

## 2023-11-17 LAB — POCT GLYCOSYLATED HEMOGLOBIN (HGB A1C): Hemoglobin A1C: 5.9 % — AB (ref 4.0–5.6)

## 2023-11-17 LAB — GLUCOSE, CAPILLARY: Glucose-Capillary: 154 mg/dL — ABNORMAL HIGH (ref 70–99)

## 2023-11-17 MED ORDER — METFORMIN HCL ER 500 MG PO TB24
500.0000 mg | ORAL_TABLET | Freq: Every day | ORAL | 2 refills | Status: DC
  Filled 2023-11-17: qty 90, 90d supply, fill #0

## 2023-11-17 MED ORDER — METFORMIN HCL ER 500 MG PO TB24
500.0000 mg | ORAL_TABLET | Freq: Every day | ORAL | 3 refills | Status: DC
  Filled 2023-11-17: qty 90, 90d supply, fill #0

## 2023-11-17 NOTE — Assessment & Plan Note (Signed)
Mammogram up to date.  

## 2023-11-17 NOTE — Assessment & Plan Note (Addendum)
 Previously managed with Amlodipine  - well-controlled without medications. She has participated in many community screenings (I saw electronic reports in the office) and she has been at goal (<130/<90). No chest pain, shortness of breath, dizziness or changes in her vision. At goal during this visit. - Continue monitoring at next OV - Will not refill amlodipine  today

## 2023-11-17 NOTE — Assessment & Plan Note (Signed)
 Orange card application and Constantino Demark' phone number provided by front desk

## 2023-11-17 NOTE — Assessment & Plan Note (Signed)
 Weight change 178 lb -> 199 today. Patient relates this to depressive episode in the fall of last year and discontinuation of daily exercise routine. She worked with a Archivist and is feeling better. Has re-started at home zumba classes, ~1HR daily. Discussed dietary modifications as well. - Continue to monitor at follow up visits

## 2023-11-17 NOTE — Assessment & Plan Note (Signed)
 Currently with intermittent episodes of menorrhagia and some other times with 1-2 days of bleeding during regular menstrual cycles. No nipple discharge. This started to occur during depressive episode. No currently on any other med or supplement  - Check cbc today - Continue to monitor

## 2023-11-17 NOTE — Patient Instructions (Signed)
 [name] , gracias por permitirnos ayudarle hoy. Durante esta visita hemos hablado acerca de los siguientes asuntos medicos:   - Pre-diabetes y [place] del peso: Por favor continue con su ejercicio diario. Le recomiendo que trabaje 150 minutos de ejercicio cardiovascular a la semana. Si puede ener 1-2 sesiones de pesas seria increible para su manejo de insulina en sus musculos  - Hipertension Felicitaciones. Como siempre, le recomiendo que se automonitoree y regrese si ve que esta aumentando  - Depresion y ansiedad Lea los documentos en este paquete. Monitoree sintomas que le indiquen que tal vez esta empeorando en vez de mejorar. Estamos aqui para usted.  Periodos - Le chequeare los niveles de hemoglobina para detectar anemia or a ver si hay necesidad si tenemos que chequear su hierro.   Ayuda financiera - por favor llame a Carlos para obtener ayuda con la aplicacion para la [place]  He ordenado los siguientes exmenes de laboratorio:   Lab Orders         Glucose, capillary         POC Hbg A1C        Medicamentos:   EMPIECE a tomar los siguientes medicamentos: Meds ordered this encounter  Medications   metFORMIN  (GLUCOPHAGE -XR) 500 MG 24 hr tablet    Sig: Take 1 tablet (500 mg total) by mouth daily with breakfast.    Dispense:  90 tablet    Refill:  2    IM program     Cita de seguimiento: 6 meses   Para contactarnos: Esperamos verte la prxima vez. Llame a nuestra clnica al telefono: [phone] si tiene alguna pregunta o inquietud. El mejor horario para llamar es de lunes a viernes de 9 a. m. a 4 p. m. Si es fuera del horario de atencin o durante el fin de [place], llame al nmero principal del hospital y pregunte por el residente de guardia de medicina interna. Si necesita reposicin de medicamentos, por favor notifique a su farmacia con una semana de anticipacin y ellos nos enviarn una solicitud.  Gracias por confiar en nosotros para  cuidar de usted!   Cathey Clunes, MD [facility]

## 2023-11-17 NOTE — Assessment & Plan Note (Addendum)
 Mild increase in A1c today, still within pre-diabetes range. No side effects to Metformin  500 mg daily.  Lab Results  Component Value Date   HGBA1C 5.9 (A) 11/17/2023   HGBA1C 5.7 (H) 10/05/2022   HGBA1C 6.2 (H) 05/26/2021  - Will continue to monitor - Last lipid panel at outside community center with tchol 184 and LDL115. 10-y ASCVD 0.6% - Will need monitoring of A1c; if diabetic, will need statin - Discussed lifestyle modifications

## 2023-11-17 NOTE — Progress Notes (Signed)
 Subjective:  CC: Prediabetes and blood pressure follow up  HPI:  Robin Alvarado is a 43 y.o. female with a past medical history stated below and presents today for Prediabetes and blood pressure follow up. Patient has not been seen in the clinic for financial difficulty and uninsured status. She is otherwise doing well.  Please see problem based assessment and plan for additional details.  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 (Patient not taking: Reported on 07/12/2023) 30 tablet 0   No current facility-administered medications on file prior to visit.    Family History  Problem Relation Age of Onset   Hypertension Father    Hypertension Brother    Prostate cancer Maternal Grandfather        maternal   Diabetes Paternal Uncle    Anesthesia problems Neg Hx    Other Neg Hx    Breast cancer Neg Hx     Social History   Socioeconomic History   Marital status: Married    Spouse name: Not on file   Number of children: Not on file   Years of education: Not on file   Highest education level: Not on file  Occupational History   Not on file  Tobacco Use   Smoking status: Never    Passive  exposure: Never   Smokeless tobacco: Never  Vaping Use   Vaping status: Never Used  Substance and Sexual Activity   Alcohol use: No   Drug use: Never   Sexual activity: Yes    Birth control/protection: Condom  Other Topics Concern   Not on file  Social History Narrative   Not on file   Social Drivers of Health   Financial Resource Strain: Not on file  Food Insecurity: Patient Declined (10/30/2023)   Hunger Vital Sign    Worried About Running Out of Food in the Last Year: Patient declined    Ran Out of Food in the Last Year: Patient declined  Transportation Needs: Patient Declined (10/30/2023)   PRAPARE - Administrator, Civil Service (Medical): Patient declined    Lack of Transportation (Non-Medical): Patient declined  Physical Activity: Not on file  Stress: Not on file  Social Connections: Not on file  Intimate Partner Violence: Patient Declined (10/30/2023)   Humiliation, Afraid, Rape, and Kick questionnaire    Fear of Current or Ex-Partner: Patient declined    Emotionally Abused: Patient declined    Physically Abused: Patient declined    Sexually Abused: Patient declined    Review of Systems: ROS negative except for what is noted on the assessment and plan.  Objective:   Vitals:   11/17/23 0852  BP: 123/71  Pulse: 71  Temp: 97.9 F (36.6 C)  TempSrc:  Oral  SpO2: 100%  Weight: 199 lb (90.3 kg)  Height: 5\' 4"  (1.626 m)    Physical Exam: Constitutional: well-appearing woman sitting in exam bed, in no acute distress HENT: normocephalic atraumatic, mucous membranes moist Eyes: conjunctiva non-erythematous Neck: supple Cardiovascular: regular rate and rhythm, no m/r/g Pulmonary/Chest: normal work of breathing on room air, lungs clear to auscultation bilaterally Abdominal: Protuberant, soft, non-tender MSK: normal bulk and tone, no lower extremity edema Neurological: alert & oriented x 3, 5/5 strength in bilateral upper and lower extremities, normal  gait Skin: warm and dry, Psych: Pleasant mood and affect       11/17/2023    8:50 AM  Depression screen PHQ 2/9  Decreased Interest 0  Down, Depressed, Hopeless 0  PHQ - 2 Score 0       Assessment & Plan:   Hypertension Previously managed with Amlodipine  - well-controlled without medications. She has participated in many community screenings (I saw electronic reports in the office) and she has been at goal (<130/<90). No chest pain, shortness of breath, dizziness or changes in her vision. At goal during this visit. - Continue monitoring at next OV - Will not refill amlodipine  today  Pre-diabetes Mild increase in A1c today, still within pre-diabetes range. No side effects to Metformin  500 mg daily.  Lab Results  Component Value Date   HGBA1C 5.9 (A) 11/17/2023   HGBA1C 5.7 (H) 10/05/2022   HGBA1C 6.2 (H) 05/26/2021  - Will continue to monitor - Last lipid panel at outside community center with tchol 184 and LDL115. 10-y ASCVD 0.6% - Will need monitoring of A1c; if diabetic, will need statin - Discussed lifestyle modifications   Encounter for weight management Weight change 178 lb -> 199 today. Patient relates this to depressive episode in the fall of last year and discontinuation of daily exercise routine. She worked with a Archivist and is feeling better. Has re-started at home zumba classes, ~1HR daily. Discussed dietary modifications as well. - Continue to monitor at follow up visits  Breast calcifications on mammogram Mammogram up to date  Menorrhagia Currently with intermittent episodes of menorrhagia and some other times with 1-2 days of bleeding during regular menstrual cycles. No nipple discharge. This started to occur during depressive episode. No currently on any other med or supplement  - Check cbc today - Continue to monitor  Encounter for screening involving social determinants of health (SDoH) Orange card application and Constantino Demark' phone number  provided by front desk    Return in about 6 months (around 05/18/2024) for Pre-diabetes and hypertension follow up.  Patient discussed with Dr. Macario Savin, MD [facility]  11/17/2023, 9:50 AM

## 2023-11-18 LAB — CBC
Hematocrit: 36.3 % (ref 34.0–46.6)
Hemoglobin: 11.5 g/dL (ref 11.1–15.9)
MCH: 26.3 pg — ABNORMAL LOW (ref 26.6–33.0)
MCHC: 31.7 g/dL (ref 31.5–35.7)
MCV: 83 fL (ref 79–97)
Platelets: 220 10*3/uL (ref 150–450)
RBC: 4.37 x10E6/uL (ref 3.77–5.28)
RDW: 13.6 % (ref 11.7–15.4)
WBC: 6.7 10*3/uL (ref 3.4–10.8)

## 2023-11-26 NOTE — Progress Notes (Signed)
 Internal Medicine Clinic Attending  Case discussed with the resident at the time of the visit.  We reviewed the resident's history and exam and pertinent patient test results.  I agree with the assessment, diagnosis, and plan of care documented in the resident's note.

## 2023-12-13 NOTE — Progress Notes (Signed)
 Pt attended 10/30/23 screening event with BP of 178/83. Pt noted at event that she does have a PCP. At event pt did not indicate any SDOH needs. Pt also noted that she is not a smoker and listed none as her insurance at the event.   Per initial f/u [facility] sent in-basket message to pt PCP about BP.  Per chart review pt does have a PCP (Robin Alvarado), insurance, and is not a smoker. Pt's last appt with PCP was 11/17/2023. Pt does not indicate any SDOH needs at this time.  No additional pt f/u to be scheduled at this time per health equity protocol.

## 2024-01-11 ENCOUNTER — Ambulatory Visit

## 2024-01-12 ENCOUNTER — Encounter: Payer: Self-pay | Admitting: *Deleted

## 2024-02-11 NOTE — Progress Notes (Signed)
 [facility] Re-screening   Interpreter- Bernice Angry, [place]   Clinical Measurement:  Vitals:   02/14/24 0923 02/14/24 0941  BP: 127/74 133/85   Fasting Labs Drawn Today, will review with patient when they result.   Medical History: Patient states that she does not have high cholesterol, does not have high blood pressure and she does not have diabetes. Patient states that she does not have history of gestational hypertension, does not have history of pre-eclampsia/eclampsia and she does not have history of gestational diabetes.    Medications: Patient states that she does not take medication to lower cholesterol, blood pressure or blood sugar.  Patient does not take an aspirin a day to help prevent a heart attack or stroke. During the past 7 days patient has not taken prescribed medication to lower blood sugar on 0 days.   Blood pressure, self measurement: Patient states that she does measure blood pressure from home. She checks her blood pressure a few times per week. She shares her readings with a health care provider: no.   Nutrition: Patient states that on average she eats 2 cups of fruit and 3 cups of vegetables per day. Patient states that she does eat fish at least 2 times per week. Patient eats less than half servings of whole grains. Patient drinks less than 36 ounces of beverages with added sugar weekly: yes. Patient is currently watching sodium or salt intake: yes. In the past 7 days patient has consumed drinks containing alcohol on 0 days. On a day that patient consumes drinks containing alcohol on average 0 drinks are consumed.      Physical activity: Patient states that she gets 180 minutes of moderate and 0 minutes of vigorous physical activity each week.  Smoking status: Patient states that she has has never smoked .   Quality of life: Over the past 2 weeks patient states that she had little interest or pleasure in doing things: not at all. She has been feeling down, depressed  or hopeless:not at all.   Social Determinants of Health Assessment:   Computer Use: During the last 12 months patient states that she has used any of the following: desktop/laptop, smart phone or tablet/other portable wireless computer: yes.   Internet Use: During the last 12 months, did you or any member of your household have access to the internet: Yes, by paying a cell phone company or internet service provider.   Food Insecurities: During the last 12 months, where there any times when you were worried that you would run out of food because of a lack of money or other resources: No.   Transportation Barriers: During the last 12 months, have you missed a doctor's appointment because of transportation problems: No.   Childcare Barriers: If you are currently using childcare services, please identify  the type of services you use. (If not using childcare services, please select Not applicable): not applicable. During the last 12 months, have you had any barriers to childcare services such as: not applicable.   Housing: What is your housing situation today: I have housing.   Intimate Partner Violence: During the last 12 months, how often did your partner physically hurt you: never. During the last 12 months, how often did your partner insult you or talk down to you: never.  Medication Adherence: During the last 12 months, did you ever forget to take your medicine: No. During the last 12 months, were you careless ar times about taking your medicine: No. During the last  12 months, when you felt better did you sometimes stop taking your medication: Yes. During the last 12 months, sometimes if you felt worse when you took your medicine did you stop taking it: Yes.   Risk reduction and counseling:   Health Coaching: Patient is doing a great job with her daily fruit and vegetable intake. Patient states that she has been consuming fish (salmon and tilapia) at least twice a week. Patient does not  consume many whole grains. Patient states that at times she does eat whole grain breakfast cereals. Gave suggestions for other whole grains that she can try incorporating into her regular diet. Patient is still doing 1 hour zumba classes 3-5 times per week. Patient stated that she stopped taking her metformin  in May because of the way it made her feel. Patient stated that she was taking it in the morning after she ate breakfast. Patient stated that it made her feel dizzy and unwell. Patient mentioned this to her PCP during her last visit. Encouraged patient to let her PCP know that she has stopped the medication. Explained to patient that their may be another type of medication that could be used that would not have the same side effects. Really encouraged patient to FU for this because she glucose and A1C were being well controlled with the medication. Patient voiced understanding.  Goal: Increase whole grain intake. Patient will consume 3 servings of whole grains weekly. Patient will work on reaching this goal over the next month.   Navigation:  I will notify patient of lab results.  Patient is aware of 2 more health coaching sessions and a follow up.

## 2024-02-14 ENCOUNTER — Other Ambulatory Visit: Payer: Self-pay

## 2024-02-14 ENCOUNTER — Inpatient Hospital Stay: Payer: Self-pay | Attending: Obstetrics and Gynecology | Admitting: *Deleted

## 2024-02-14 VITALS — BP 133/85 | Ht 64.0 in | Wt 190.3 lb

## 2024-02-14 DIAGNOSIS — Z0000 Encounter for general adult medical examination without abnormal findings: Secondary | ICD-10-CM

## 2024-02-15 LAB — LIPID PANEL
Chol/HDL Ratio: 4.4 ratio (ref 0.0–4.4)
Cholesterol, Total: 230 mg/dL — ABNORMAL HIGH (ref 100–199)
HDL: 52 mg/dL (ref >39)
LDL Chol Calc (NIH): 145 mg/dL — ABNORMAL HIGH (ref 0–99)
Triglycerides: 183 mg/dL — ABNORMAL HIGH (ref 0–149)
VLDL Cholesterol Cal: 33 mg/dL (ref 5–40)

## 2024-02-15 LAB — HEMOGLOBIN A1C
Est. average glucose Bld gHb Est-mCnc: 120 mg/dL
Hgb A1c MFr Bld: 5.8 % — ABNORMAL HIGH (ref 4.8–5.6)

## 2024-02-15 LAB — GLUCOSE, RANDOM: Glucose: 93 mg/dL (ref 70–99)

## 2024-02-21 ENCOUNTER — Ambulatory Visit: Payer: Self-pay

## 2024-02-21 NOTE — Telephone Encounter (Signed)
 Health coaching 2   interpreter- Bernice Angry, [employer]   Labs- 230 cholesterol, 145 LDL cholesterol, 183 triglycerides, 52 HDL cholesterol, 5.8 hemoglobin A1C, 93 mean plasma glucose. Patient understands and is aware of her lab results.   Goals-  1. Reduce the amount of fried and fatty foods consumed. Try to grill, bake, broil or sautee in a small amount of olive oil. Air fryer can used as well. 2. Reduce the amount of red meats consumed. Substitute for lean proteins like chicken, fish or [place]. 3. Look for low-fat or reduced fat dairy options. 4. Increase whole grain intake. 5. Reduce the amount of sweet and sugary foods consumed. 6. Reduce the amount of carb rich foods consumed. 7. Continue with weekly zumba classes. Goal of 20-30 minutes daily.   Navigation:  Patient is aware of 1 more health coaching sessions and a follow up. Referred patient to Internal Medicine for elevated labs. Will call patient with appointment info once scheduled.

## 2024-03-01 ENCOUNTER — Encounter

## 2024-03-01 DIAGNOSIS — E785 Hyperlipidemia, unspecified: Secondary | ICD-10-CM | POA: Insufficient documentation

## 2024-03-01 NOTE — Assessment & Plan Note (Signed)
 Lab Results  Component Value Date   HGBA1C 5.8 (H) 02/14/2024   HGBA1C 5.9 (A) 11/17/2023   HGBA1C 5.7 (H) 10/05/2022   Metformin  500 mg

## 2024-03-01 NOTE — Progress Notes (Deleted)
 CC: ***  HPI:  [name] is a 43 y.o. female with pertinent past medical history of HTN, prediabetes, encounter for weight management, and menorrhagia (further medical history stated below) and presents today for ***. Please see problem based assessment and plan for additional details.  Last clinic appointment: 11/17/2023 Last Hospitalization documented: 01/13/2022  Last Pertinent Labs Documented:     Latest Ref Rng & Units 02/14/2024    9:48 AM 10/05/2022    9:40 AM 01/07/2022   12:39 PM  BMP  Glucose 70 - 99 mg/dL 93  95  881   BUN 6 - 20 mg/dL   7   Creatinine 9.55 - 1.00 mg/dL   9.39   Sodium 864 - 854 mmol/L   139   Potassium 3.5 - 5.1 mmol/L   4.4   Chloride 98 - 111 mmol/L   108   CO2 22 - 32 mmol/L   22   Calcium 8.9 - 10.3 mg/dL   9.3        Latest Ref Rng & Units 11/17/2023    9:23 AM 08/21/2017    5:14 AM 08/20/2017    8:00 AM  CBC  WBC 3.4 - 10.8 x10E3/uL 6.7  8.6  5.5   Hemoglobin 11.1 - 15.9 g/dL 88.4  88.6  87.2   Hematocrit 34.0 - 46.6 % 36.3  34.0  38.6   Platelets 150 - 450 x10E3/uL 220  222  261     Lab Results  Component Value Date   HGBA1C 5.8 (H) 02/14/2024   HGBA1C 5.9 (A) 11/17/2023   HGBA1C 5.7 (H) 10/05/2022     Past Medical History:  Diagnosis Date   Advanced maternal age in multigravida, second trimester    Breech birth 01/04/2015   Diabetes mellitus without complication (HCC)    Gestational hypertension 08/22/2017   Hereditary disease in family possibly affecting fetus, fetus 1    History of shoulder dystocia in prior pregnancy 08/20/2017   History of shoulder dystocia in prior pregnancy, currently pregnant in third trimester 07/30/2017   Normal delivery 08/20/2017   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 (Patient not taking: Reported on 02/14/2024) 30 tablet 0   metFORMIN  (GLUCOPHAGE -XR) 500 MG 24 hr tablet Take 1 tablet (500 mg total) by mouth daily with breakfast. (Patient not taking: Reported on 02/14/2024) 90 tablet 3   No current facility-administered medications on file prior to visit.    Family History  Problem Relation Age of Onset   Hypertension Father    Hypertension Brother    Prostate cancer Maternal Grandfather        maternal   Diabetes Paternal Uncle    Anesthesia problems Neg Hx    Other Neg Hx    Breast cancer Neg Hx     Social History   Socioeconomic History   Marital status: Married    Spouse name: Not on file   Number of children: Not on file   Years of education: Not on file   Highest education level: Not on file  Occupational History   Not on file  Tobacco Use   Smoking status: Never    Passive exposure: Never   Smokeless  tobacco: Never  Vaping Use   Vaping status: Never Used  Substance and Sexual Activity   Alcohol use: No   Drug use: Never   Sexual activity: Yes    Birth control/protection: Condom  Other Topics Concern   Not on file  Social History Narrative   Not on file   Social Drivers of Health   Financial Resource Strain: Not on file  Food Insecurity: Patient Declined (10/30/2023)   Hunger Vital Sign    Worried About Running Out of Food in the Last Year: Patient declined    Ran Out of Food in the Last Year: Patient declined  Transportation Needs: Patient Declined (10/30/2023)   PRAPARE - Administrator, Civil Service (Medical): Patient declined    Lack of Transportation (Non-Medical): Patient declined  Physical Activity: Not on file  Stress: Not on file  Social Connections: Not on file  Intimate Partner Violence: Patient Declined (10/30/2023)   Humiliation, Afraid, Rape, and Kick questionnaire    Fear of Current or Ex-Partner:  Patient declined    Emotionally Abused: Patient declined    Physically Abused: Patient declined    Sexually Abused: Patient declined    Review of Systems: ROS   There were no vitals filed for this visit.  Physical Exam: Physical Exam   Assessment & Plan:   Patient seen with Dr. {[id number]::Tpoopjfd,Z. Hoffman,Mullen,Narendra,Vincent,Guilloud,Lau,Machen,Winfrey}  Assessment & Plan Hyperlipidemia, unspecified hyperlipidemia type Lipid Panel     Component Value Date/Time   CHOL 230 (H) 02/14/2024 0948   TRIG 183 (H) 02/14/2024 0948   HDL 52 02/14/2024 0948   CHOLHDL 4.4 02/14/2024 0948   LDLCALC 145 (H) 02/14/2024 0948   LABVLDL 33 02/14/2024 0948    Pre-diabetes Lab Results  Component Value Date   HGBA1C 5.8 (H) 02/14/2024   HGBA1C 5.9 (A) 11/17/2023   HGBA1C 5.7 (H) 10/05/2022   Metformin  500 mg  Hypertension, unspecified type There were no vitals filed for this visit.    No orders of the defined types were placed in this encounter.    Sallyanne Primas, D.O. [facility], PGY-1 Date 03/01/2024 Time 8:31 AM

## 2024-03-01 NOTE — Assessment & Plan Note (Signed)
 There were no vitals filed for this visit.

## 2024-03-01 NOTE — Assessment & Plan Note (Signed)
 Lipid Panel     Component Value Date/Time   CHOL 230 (H) 02/14/2024 0948   TRIG 183 (H) 02/14/2024 0948   HDL 52 02/14/2024 0948   CHOLHDL 4.4 02/14/2024 0948   LDLCALC 145 (H) 02/14/2024 0948   LABVLDL 33 02/14/2024 0948

## 2024-03-02 ENCOUNTER — Ambulatory Visit: Payer: Self-pay

## 2024-03-02 ENCOUNTER — Other Ambulatory Visit: Payer: Self-pay

## 2024-03-02 VITALS — BP 125/63 | HR 60 | Temp 98.0°F | Ht 66.0 in | Wt 190.2 lb

## 2024-03-02 DIAGNOSIS — E785 Hyperlipidemia, unspecified: Secondary | ICD-10-CM

## 2024-03-02 DIAGNOSIS — R7303 Prediabetes: Secondary | ICD-10-CM

## 2024-03-02 NOTE — Patient Instructions (Addendum)
 Hoy hablamos [employer] siguientes afecciones mdicas y el plan:  Diabetes y colesterol - Ladora recomendar a nuestra dietista titulada para hablar sobre cambios en la dieta que le ayudarn a reducir an ms su A1C y Oncologist. - Mientras tanto, siga comiendo bien y haciendo ejercicio a diario.  Esperamos verlo/a la prxima vez. Si tiene alguna pregunta o inquietud, llame a nuestra clnica al [phone]. El mejor horario para llamar es de lunes a viernes de 9:00 a. m. a 4:00 p. m., pero hay alguien disponible las 24 horas, los 7 [address]  [address] de la [place]. Si necesita resurtidos de [employer], notifique a su farmacia con una semana de anticipacin y nos enviarn una solicitud.  Gracias por confiar en m. Le deseo lo mejor!  Carolyne Whitsel, DO [facility]  _______________________________________________________________________________  Today we discussed the following medical conditions and plan:   Diabetes and Cholesterol  -I will refer you to our registered dietitian to talk more about dietary changes to help lower your A1C further and cholesterol  -in the meantime, continue eating well and exercising daily  We look forward to seeing you next time. Please call our clinic at [phone] if you have any questions or concerns. The best time to call is Monday-Friday from 9am-4pm, but there is someone available 24/7. If you need medication refills, please notify your pharmacy one week in advance and they will send us  a request.   Thank you for trusting me with your care. Wishing you the best!   Sallyanne Primas, DO  [facility]

## 2024-03-02 NOTE — Assessment & Plan Note (Addendum)
 A1C down from 5.9 to 5.8. Patient stated she had stopped Metformin  around April, stating it made her feel weird. Feels much better since being off Metformin , has last 9 pounds since last appointment through intentional weight loss, and has instituted dietary changes. Discussed with patient about starting a OTC daily multivitamin. -printed information for patient regarding lifestyle and dietary changes that can be made in patients with pre-diabetes -repeat A1C in 3 months  -referral to registered dietitian for diabetes management  -start daily OTC multivitamin

## 2024-03-02 NOTE — Progress Notes (Signed)
 CC: elevated lipid labs and pre-diabetes  HPI:  Robin Alvarado is a Spanish speaking 43 y.o. female with pertinent past medical history of prediabetes (further medical history stated below) and presents today for elevated lipid labs. Please see problem based assessment and plan for additional details.  Patient stated last mammogram was in January of 2025. Chart review records negative family history of breast cancer.   Last clinic appointment: 11/17/2023 Last Hospitalization documented: 01/13/2022  Last Pertinent Labs Documented:     Latest Ref Rng & Units 02/14/2024    9:48 AM 10/05/2022    9:40 AM 01/07/2022   12:39 PM  BMP  Glucose 70 - 99 mg/dL 93  95  881   BUN 6 - 20 mg/dL   7   Creatinine 9.55 - 1.00 mg/dL   9.39   Sodium 864 - 854 mmol/L   139   Potassium 3.5 - 5.1 mmol/L   4.4   Chloride 98 - 111 mmol/L   108   CO2 22 - 32 mmol/L   22   Calcium 8.9 - 10.3 mg/dL   9.3        Latest Ref Rng & Units 11/17/2023    9:23 AM 08/21/2017    5:14 AM 08/20/2017    8:00 AM  CBC  WBC 3.4 - 10.8 x10E3/uL 6.7  8.6  5.5   Hemoglobin 11.1 - 15.9 g/dL 88.4  88.6  87.2   Hematocrit 34.0 - 46.6 % 36.3  34.0  38.6   Platelets 150 - 450 x10E3/uL 220  222  261     Lab Results  Component Value Date   HGBA1C 5.8 (H) 02/14/2024   HGBA1C 5.9 (A) 11/17/2023   HGBA1C 5.7 (H) 10/05/2022     Past Medical History:  Diagnosis Date   Advanced maternal age in multigravida, second trimester    Breech birth 01/04/2015   Diabetes mellitus without complication (HCC)    Gestational hypertension 08/22/2017   Hereditary disease in family possibly affecting fetus, fetus 1    History of shoulder dystocia in prior pregnancy 08/20/2017   History of shoulder dystocia in prior pregnancy, currently pregnant in third trimester 07/30/2017   Normal delivery 08/20/2017   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 (Patient not taking: Reported on 02/14/2024) 30 tablet 0   metFORMIN  (GLUCOPHAGE -XR) 500 MG 24 hr tablet Take 1 tablet (500 mg total) by mouth daily with breakfast. (Patient not taking: Reported on 02/14/2024) 90 tablet 3   No current facility-administered medications on file prior to visit.    Family History  Problem Relation Age of Onset   Hypertension Father    Hypertension Brother    Prostate cancer Maternal Grandfather        maternal   Diabetes Paternal Uncle    Anesthesia problems Neg Hx    Other Neg Hx    Breast cancer Neg Hx     Social History   Socioeconomic History   Marital status: Married    Spouse name: Not on file   Number of children: Not on file   Years of education: Not on file   Highest education level: Not on file  Occupational History  Not on file  Tobacco Use   Smoking status: Never    Passive exposure: Never   Smokeless tobacco: Never  Vaping Use   Vaping status: Never Used  Substance and Sexual Activity   Alcohol use: No   Drug use: Never   Sexual activity: Yes    Birth control/protection: Condom  Other Topics Concern   Not on file  Social History Narrative   Not on file   Social Drivers of Health   Financial Resource Strain: Not on file  Food Insecurity: Patient Declined (10/30/2023)   Hunger Vital Sign    Worried About Running Out of Food in the Last Year: Patient declined    Ran Out of Food in the Last Year: Patient declined  Transportation Needs: Patient Declined (10/30/2023)   PRAPARE - Administrator, Civil Service (Medical): Patient declined    Lack of Transportation (Non-Medical): Patient declined  Physical Activity: Not on file  Stress: Not on file  Social Connections: Not on file  Intimate  Partner Violence: Patient Declined (10/30/2023)   Humiliation, Afraid, Rape, and Kick questionnaire    Fear of Current or Ex-Partner: Patient declined    Emotionally Abused: Patient declined    Physically Abused: Patient declined    Sexually Abused: Patient declined    Review of Systems: Review of Systems  Constitutional:  Positive for weight loss (intentional weight loss (199-190)). Negative for chills and fever.  Eyes:  Negative for blurred vision.  Respiratory:  Negative for shortness of breath.   Cardiovascular:  Negative for chest pain.  Gastrointestinal:  Negative for abdominal pain, blood in stool, constipation, diarrhea, melena, nausea and vomiting.  Neurological:  Positive for headaches (general headaches resolved with otc pain medication). Negative for tingling.     Vitals:   03/02/24 0856  BP: 125/63  Pulse: 60  Temp: 98 F (36.7 C)  TempSrc: Oral  SpO2: 100%  Weight: 190 lb 3.2 oz (86.3 kg)  Height: 5' 6 (1.676 m)    Physical Exam: Physical Exam Cardiovascular:     Rate and Rhythm: Normal rate and regular rhythm.     Heart sounds: Normal heart sounds. No murmur heard.    No friction rub. No gallop.  Pulmonary:     Effort: Pulmonary effort is normal.     Breath sounds: Normal breath sounds. No stridor. No wheezing, rhonchi or rales.  Abdominal:     General: Bowel sounds are normal.     Palpations: Abdomen is soft.     Tenderness: There is no abdominal tenderness. There is no guarding.  Musculoskeletal:     Right lower leg: No edema.     Left lower leg: No edema.  Skin:    General: Skin is warm and dry.  Neurological:     Mental Status: She is alert.      Assessment & Plan:   Patient seen with Dr. Jeanelle  Assessment & Plan Hyperlipidemia, unspecified hyperlipidemia type Lipid Panel     Component Value Date/Time   CHOL 230 (H) 02/14/2024 0948   TRIG 183 (H) 02/14/2024 0948   HDL 52 02/14/2024 0948   CHOLHDL 4.4 02/14/2024 0948   LDLCALC 145  (H) 02/14/2024 0948   LABVLDL 33 02/14/2024 0948  Patient and I discussed medication management of a statin, however patient stated she would prefer to do lifestyle modifications to try and get cholesterol lower such as with diet and exercise. Through shared decision making, I encouraged the patient to continue exercising daily  and eating a diet low in high cholesterol foods. Patient does not smoke. Chart review shows family history of HTN and diabetes.  -printed information for patient regarding lifestyle and dietary changes that can be made to lower cholesterol  -f/u in 3 months for repeat lipid panel. Ensure fasting.  Pre-diabetes A1C down from 5.9 to 5.8. Patient stated she had stopped Metformin  around April, stating it made her feel weird. Feels much better since being off Metformin , has last 9 pounds since last appointment through intentional weight loss, and has instituted dietary changes. Discussed with patient about starting a OTC daily multivitamin. -printed information for patient regarding lifestyle and dietary changes that can be made in patients with pre-diabetes -repeat A1C in 3 months  -referral to registered dietitian for diabetes management  -start daily OTC multivitamin    Orders Placed This Encounter  Procedures   Referral to Nutrition and Diabetes Services    Referral Priority:   Routine    Referral Type:   Consultation    Referral Reason:   Specialty Services Required    Referred to Provider:   Plyler, Arland HERO, RD    Number of Visits Requested:   1     Ashlee Player, D.O. [facility], PGY-1 Date 03/02/2024 Time 9:42 AM

## 2024-03-02 NOTE — Progress Notes (Signed)
 Internal Medicine Clinic Attending  I was physically present during the key portions of the resident provided service and participated in the medical decision making of patient's management care. I reviewed pertinent patient test results.  The assessment, diagnosis, and plan were formulated together and I agree with the documentation in the resident's note.  Carney Living, MD

## 2024-03-02 NOTE — Assessment & Plan Note (Addendum)
 Lipid Panel     Component Value Date/Time   CHOL 230 (H) 02/14/2024 0948   TRIG 183 (H) 02/14/2024 0948   HDL 52 02/14/2024 0948   CHOLHDL 4.4 02/14/2024 0948   LDLCALC 145 (H) 02/14/2024 0948   LABVLDL 33 02/14/2024 0948  Patient and I discussed medication management of a statin, however patient stated she would prefer to do lifestyle modifications to try and get cholesterol lower such as with diet and exercise. Through shared decision making, I encouraged the patient to continue exercising daily and eating a diet low in high cholesterol foods. Patient does not smoke. Chart review shows family history of HTN and diabetes.  -printed information for patient regarding lifestyle and dietary changes that can be made to lower cholesterol  -f/u in 3 months for repeat lipid panel. Ensure fasting.

## 2024-03-06 ENCOUNTER — Ambulatory Visit

## 2024-07-06 ENCOUNTER — Other Ambulatory Visit (HOSPITAL_COMMUNITY): Payer: Self-pay | Admitting: Obstetrics & Gynecology

## 2024-07-06 ENCOUNTER — Encounter: Payer: Self-pay | Admitting: Nurse Practitioner

## 2024-07-06 DIAGNOSIS — Z3480 Encounter for supervision of other normal pregnancy, unspecified trimester: Secondary | ICD-10-CM

## 2024-07-06 DIAGNOSIS — O3680X0 Pregnancy with inconclusive fetal viability, not applicable or unspecified: Secondary | ICD-10-CM

## 2024-07-06 LAB — OB RESULTS CONSOLE ABO/RH: "RH Type ": POSITIVE

## 2024-07-06 LAB — OB RESULTS CONSOLE HGB/HCT, BLOOD
HCT: 39 (ref 29–41)
Hemoglobin: 12.9

## 2024-07-06 LAB — URINE CULTURE
Drug Screen, Urine: NEGATIVE
Glucose 1 Hour: 153
Urine Culture, OB: NEGATIVE

## 2024-07-06 LAB — OB RESULTS CONSOLE GC/CHLAMYDIA
Chlamydia: NEGATIVE
Neisseria Gonorrhea: NEGATIVE

## 2024-07-06 LAB — OB RESULTS CONSOLE HEPATITIS B SURFACE ANTIGEN: Hepatitis B Surface Ag: NEGATIVE

## 2024-07-06 LAB — OB RESULTS CONSOLE ANTIBODY SCREEN: Antibody Screen: NEGATIVE

## 2024-07-06 LAB — OB RESULTS CONSOLE VARICELLA ZOSTER ANTIBODY, IGG: Varicella: IMMUNE

## 2024-07-06 LAB — OB RESULTS CONSOLE HIV ANTIBODY (ROUTINE TESTING): HIV: NONREACTIVE

## 2024-07-06 LAB — OB RESULTS CONSOLE PLATELET COUNT: Platelets: 219

## 2024-07-06 LAB — OB RESULTS CONSOLE RUBELLA ANTIBODY, IGM: Rubella: IMMUNE

## 2024-07-06 LAB — OB RESULTS CONSOLE RPR: RPR: NONREACTIVE

## 2024-07-07 LAB — GLUCOSE, 1 HOUR
Glucose 1 Hour: 205
Glucose 2 Hour: 182
Glucose 3 Hour: 134
Glucose, Fasting: 101

## 2024-07-10 ENCOUNTER — Ambulatory Visit (HOSPITAL_COMMUNITY)
Admission: RE | Admit: 2024-07-10 | Discharge: 2024-07-10 | Disposition: A | Payer: Self-pay | Source: Ambulatory Visit | Attending: Obstetrics & Gynecology | Admitting: Obstetrics & Gynecology

## 2024-07-10 DIAGNOSIS — Z3480 Encounter for supervision of other normal pregnancy, unspecified trimester: Secondary | ICD-10-CM | POA: Insufficient documentation

## 2024-07-10 DIAGNOSIS — Z3689 Encounter for other specified antenatal screening: Secondary | ICD-10-CM | POA: Insufficient documentation

## 2024-07-10 DIAGNOSIS — N83291 Other ovarian cyst, right side: Secondary | ICD-10-CM | POA: Insufficient documentation

## 2024-07-10 DIAGNOSIS — Z3A11 11 weeks gestation of pregnancy: Secondary | ICD-10-CM | POA: Insufficient documentation

## 2024-07-25 ENCOUNTER — Other Ambulatory Visit: Payer: Self-pay

## 2024-07-25 ENCOUNTER — Ambulatory Visit (INDEPENDENT_AMBULATORY_CARE_PROVIDER_SITE_OTHER): Payer: Self-pay

## 2024-07-25 VITALS — BP 128/80 | HR 89 | Wt 193.0 lb

## 2024-07-25 DIAGNOSIS — Z603 Acculturation difficulty: Secondary | ICD-10-CM

## 2024-07-25 DIAGNOSIS — O09522 Supervision of elderly multigravida, second trimester: Secondary | ICD-10-CM

## 2024-07-25 DIAGNOSIS — O09299 Supervision of pregnancy with other poor reproductive or obstetric history, unspecified trimester: Secondary | ICD-10-CM | POA: Insufficient documentation

## 2024-07-25 DIAGNOSIS — Z3A14 14 weeks gestation of pregnancy: Secondary | ICD-10-CM

## 2024-07-25 DIAGNOSIS — O3662X0 Maternal care for excessive fetal growth, second trimester, not applicable or unspecified: Secondary | ICD-10-CM

## 2024-07-25 DIAGNOSIS — O09292 Supervision of pregnancy with other poor reproductive or obstetric history, second trimester: Secondary | ICD-10-CM

## 2024-07-25 DIAGNOSIS — Z758 Other problems related to medical facilities and other health care: Secondary | ICD-10-CM

## 2024-07-25 DIAGNOSIS — O0990 Supervision of high risk pregnancy, unspecified, unspecified trimester: Secondary | ICD-10-CM | POA: Insufficient documentation

## 2024-07-25 DIAGNOSIS — O3660X0 Maternal care for excessive fetal growth, unspecified trimester, not applicable or unspecified: Secondary | ICD-10-CM | POA: Insufficient documentation

## 2024-07-25 DIAGNOSIS — O0992 Supervision of high risk pregnancy, unspecified, second trimester: Secondary | ICD-10-CM

## 2024-07-25 DIAGNOSIS — O09529 Supervision of elderly multigravida, unspecified trimester: Secondary | ICD-10-CM | POA: Insufficient documentation

## 2024-07-25 DIAGNOSIS — O24419 Gestational diabetes mellitus in pregnancy, unspecified control: Secondary | ICD-10-CM

## 2024-07-25 NOTE — Progress Notes (Unsigned)
 "    Subjective:   Robin Alvarado is a 43 y.o. G5P4004 at 102w0d by 11wk ultrasound being seen today for her first obstetrical visit.  Her obstetrical history is significant for advanced maternal age and has Pre-diabetes; Breast calcifications on mammogram; Hyperlipidemia; Supervision of high risk pregnancy, antepartum; History of LGA (large for gestational age); H/O shoulder dystocia in prior pregnancy, currently pregnant; GDM (gestational diabetes mellitus); and AMA (advanced maternal age) multigravida 35+ on their problem list.. Patient does intend to breast feed. Pregnancy history fully reviewed.  Currently on PNV, aspirin and tylenol  PRN.  G2 - shoulder dystocia, turtle sign, per patient they grabbed her shoulders and pulled her out Largest baby G4 - 4570g - vaginal delivery - no complications per patient  Has GDM, first time.   Patient reports no complaints.  HISTORY: OB History  Gravida Para Term Preterm AB Living  5 4 4  0 0 4  SAB IAB Ectopic Multiple Live Births  0 0 0 0 4    # Outcome Date GA Lbr Len/2nd Weight Sex Type Anes PTL Lv  5 Current           4 Term 08/20/17 [redacted]w[redacted]d / 00:05 10 lb 1.2 oz (4.57 kg) M Vag-Spont None  LIV     Name: HERRERA RODRIGUEZ,BOY Briele     Apgar1: 8  Apgar5: 9  3 Term 01/04/15 [redacted]w[redacted]d 05:45 / 00:09 8 lb 9.9 oz (3.91 kg) F Vag-Spont Local  LIV     Birth Comments: none     Apgar1: 7  Apgar5: 9  2 Term 06/05/13 [redacted]w[redacted]d 12:23 / 00:13 9 lb 1.9 oz (4.136 kg) F Vag-Spont Local  LIV     Name: HERRERA RODRIGUEZ,GIRL Katherene     Apgar1: 8  Apgar5: 9  1 Term 01/23/12 [redacted]w[redacted]d 18:39 / 03:00 9 lb 3.6 oz (4.185 kg) M Vag-Spont Local  LIV     Birth Comments: WNL     Name: HERRERA RODRIGUEZ,BOY Dayrin     Apgar1: 8  Apgar5: 9   Past Medical History:  Diagnosis Date   Advanced maternal age in multigravida, second trimester    Breech birth 01/04/2015   Diabetes mellitus without complication (HCC)    Encounter for screening involving social  determinants of health (SDoH) 11/17/2023   Encounter for weight management 11/17/2023   Gestational hypertension 08/22/2017   Healthcare maintenance 06/09/2021   Hereditary disease in family possibly affecting fetus, fetus 1    History of shoulder dystocia in prior pregnancy 08/20/2017   History of shoulder dystocia in prior pregnancy, currently pregnant in third trimester 07/30/2017   Menorrhagia 11/17/2023   Normal delivery 08/20/2017   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/02/2014   Pregnancy 01/04/2015   Third degree perineal laceration 06/07/2013   Past Surgical History:  Procedure Laterality Date   EXCISION OF BREAST BIOPSY Right 01/13/2022   Procedure: RIGHT BREAST EXCISIONAL BIOPSY;  Surgeon: Belinda Cough, MD;  Location: [facility];  Service: General;  Laterality: Right;   EXCISION OF BREAST BIOPSY Right 01/13/2022   benign   NO PAST SURGERIES     TOOTH EXTRACTION     Family History  Problem Relation Age of Onset   Hypertension Father    Hypertension Brother    Prostate cancer Maternal Grandfather        maternal   Diabetes Paternal Uncle    Anesthesia problems Neg Hx    Other Neg Hx  Breast cancer Neg Hx    Social History[1] Allergies[2] Medications Ordered Prior to Encounter[3]   Exam   Vitals:   07/25/24 1001  BP: 128/80  Pulse: 89  Weight: 193 lb (87.5 kg)   Fetal Heart Rate (bpm): 154  Uterus:     Pelvic Exam: Perineum: no hemorrhoids, normal perineum   Vulva: normal external genitalia, no lesions   Vagina:  normal mucosa, normal discharge   Cervix: no lesions and normal, pap smear done.    Adnexa: normal adnexa and no mass, fullness, tenderness   Bony Pelvis: average  System: General: well-developed, well-nourished female in no acute distress   Breast:  normal appearance, no masses or tenderness   Skin: normal coloration and turgor, no rashes   Neurologic: oriented, normal, negative,  normal mood   Extremities: normal strength, tone, and muscle mass, ROM of all joints is normal   HEENT PERRLA, extraocular movement intact and sclera clear, anicteric   Mouth/Teeth mucous membranes moist, pharynx normal without lesions and dental hygiene good   Neck supple and no masses   Cardiovascular: regular rate and rhythm   Respiratory:  no respiratory distress, normal breath sounds   Abdomen: soft, non-tender; bowel sounds normal; no masses,  no organomegaly     Assessment:   Pregnancy: [id number] Patient Active Problem List   Diagnosis Date Noted   Supervision of high risk pregnancy, antepartum 07/25/2024   History of LGA (large for gestational age) 07/25/2024   H/O shoulder dystocia in prior pregnancy, currently pregnant 07/25/2024   GDM (gestational diabetes mellitus) 07/25/2024   AMA (advanced maternal age) multigravida 35+ 07/25/2024   Hyperlipidemia 03/01/2024   Pre-diabetes 06/09/2021   Breast calcifications on mammogram 06/09/2021     Plan:  1. Supervision of high risk pregnancy, antepartum (Primary) -Anatomy ultrasound ordered - US  MFM OB DETAIL +14 WK; Future  2. History of LGA (large for gestational age) - Delivered 4g baby girl vaginally in 2019, no shoulder dystocia in this delivery per patient or in delivery note  3. H/O shoulder dystocia in prior pregnancy, currently pregnant - 1 minute shoulder dystocia noted in 2016 delivery, resolved with McRoberts, suprapubic pressure and [place] maneuver, delivered with somersault maneuver - 30 second shoulder dystocia noted in 2014 delivery, resolved with McRobert and delivery of posterior arm   4. Gestational diabetes mellitus (GDM), antepartum, gestational diabetes method of control unspecified - Recently diagnosed, discussed dietary changes and provided handout - Plan to meet with Nutrition at [facility] for further education and supplies but coordinator is not in the office today, will plan to reschedule visit  5.  Antepartum multigravida of advanced maternal age - Will need IOL by 40 weeks  6. Language barrier Due to language barrier, an interpreter was present during the history-taking and subsequent discussion (and for part of the physical exam) with this patient.    Initial labs drawn. Continue prenatal vitamins. Discussed and offered genetic screening options, including Quad screen/AFP, NIPS testing, and option to decline testing. Benefits/risks/alternatives reviewed. Pt aware that anatomy US  is form of genetic screening with lower accuracy in detecting trisomies than blood work.  Pt previously completed genetic screening at [facility]. Ultrasound discussed; fetal anatomic survey: ordered. Problem list reviewed and updated. The nature of [place] [facility] [facility] with multiple MDs and other Advanced Practice Providers was explained to patient; also emphasized that residents, students are part of our team. Routine obstetric precautions reviewed.  Return in about 4 weeks (around 08/22/2024) for Routine prenatal  visit.   Charlie DELENA Courts, MD 07/27/2024 7:14 AM     [1]  Social History Tobacco Use   Smoking status: Never    Passive exposure: Never   Smokeless tobacco: Never  Vaping Use   Vaping status: Never Used  Substance Use Topics   Alcohol use: No   Drug use: Never  [2]  Allergies Allergen Reactions   Shellfish Allergy Diarrhea and Other (See Comments)    Shrimp- gets a stomach ache when she consumes.  [3]  Current Outpatient Medications on File Prior to Visit  Medication Sig Dispense Refill   acetaminophen  (TYLENOL ) 325 MG tablet Take 650 mg by mouth every 6 (six) hours as needed.     aspirin 81 MG chewable tablet Chew 81 mg by mouth daily.     Prenatal Vit-Fe Fumarate-FA (MULTIVITAMIN-PRENATAL) 27-0.8 MG TABS tablet Take 1 tablet by mouth daily at 12 noon.     No current facility-administered medications on file prior to visit.   "

## 2024-07-31 NOTE — Progress Notes (Signed)
 Patient was seen for Pre-Diabetes during Pregnancy on 08/03/2024  Start time 1100 and End time 1215   Estimated due date: 12/27/2024; [redacted]w[redacted]d  Spanish Interpreter from [employer];  [phone]  Clinical: Medications: Current Medications[1]  Medical History:  Past Medical History:  Diagnosis Date   Advanced maternal age in multigravida, second trimester    Breech birth 01/04/2015   Diabetes mellitus without complication (HCC)    Encounter for screening involving social determinants of health (SDoH) 11/17/2023   Encounter for weight management 11/17/2023   Gestational hypertension 08/22/2017   Healthcare maintenance 06/09/2021   Hereditary disease in family possibly affecting fetus, fetus 1    History of shoulder dystocia in prior pregnancy 08/20/2017   History of shoulder dystocia in prior pregnancy, currently pregnant in third trimester 07/30/2017   Menorrhagia 11/17/2023   Normal delivery 08/20/2017   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 Pt denies self monitoring blood sugar and/or needing medication in prior pregnancies for blood sugar. Patient does the shopping and cooking for a family of seven. 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 Pt reports eating out once weekly or less. Pt reports making the following changes including increasing walking and decreasing intake of sweet things including sugary sweetened beverages and desserts. All Pt's questions were answered during this encounter.    Physical Activity: walking on 4 d/w for 30-40 minutes  Stress: 5 out of 10 / self care includes:  walk Sleep: good   24 hr Recall:   ~8-9 am:  spinach salad with ranch dressing or 1 slice of whole  wheat bread with cheese, tomato or flour tortilla with chicken and cheese, water   ~ 10 am:  orange or apple   ~ 11-11:30 am beans, eggs, water with lemon   ~ 1 pm : carrot, cumbers ~ 3 pm: noodles, meat sauce or salad with chicken  ~5 pm: apple or orange ~7-7:30 pm: salad with dressing  ~8-9 pm:  orange or cucumber  Beverages:  water, water with lemon  NUTRITION INTERVENTION  Nutrition education (E-1) on the following topics:   Initial Follow-up  [x]  []  Definition of Gestational Diabetes [x]  []  Why dietary management is important in controlling blood glucose Poorly controlled diabetes can lead to fetal macrosomia, shoulder dystocia and neurological injuries, stillbirth and neonatal complications including respiratory distress syndrome, hypoglycemia and prolonged NICU admission.  [x]  []  Effects each nutrient has on blood glucose levels [x]  []  Simple carbohydrates vs complex carbohydrates [x]  []  Fluid intake [x]  []  Creating a balanced meal plan [x]  []  Carbohydrate counting  [x]  []  When to check blood glucose levels [x]  []  Proper blood glucose monitoring techniques [x]  []  Effect of stress and stress reduction techniques  [x]  []  Exercise effect on blood glucose levels, appropriate exercise during pregnancy [x]  []  Importance of limiting caffeine and abstaining from alcohol and smoking [x]  []  Medications used for blood sugar control during pregnancy [x]  []  Hypoglycemia and rule of 15 [x]  []  Postpartum self care  Blood glucose monitor given: [employer] Code Lot # [id number] Exp: [id number] A-4 CBG: 81 mg/dL, reported as 2.5 hour post prandial per Pt   Patient is instructed to begin testing pre breakfast and 2 hours after each meal. Patient  instructed to monitor glucose levels: QID FBS: 60 - <= 95 mg/dL; 2 hour: <= 879 mg/dL  Patient received handouts: Spanish Nutrition Diabetes and Pregnancy Carbohydrate Counting List Blood glucose log Snack ideas for diabetes during  pregnancy Plate Planner  Patient will be seen for follow-up: 09/14/2024    [1]  Current Outpatient Medications:    acetaminophen  (TYLENOL ) 325 MG tablet, Take 650 mg by mouth every 6 (six) hours as needed., Disp: , Rfl:    aspirin 81 MG chewable tablet, Chew 81 mg by mouth daily., Disp: , Rfl:    Prenatal Vit-Fe Fumarate-FA (MULTIVITAMIN-PRENATAL) 27-0.8 MG TABS tablet, Take 1 tablet by mouth daily at 12 noon., Disp: , Rfl:

## 2024-08-03 ENCOUNTER — Ambulatory Visit: Payer: Self-pay | Admitting: Dietician

## 2024-08-03 ENCOUNTER — Encounter: Payer: Self-pay | Admitting: *Deleted

## 2024-08-03 ENCOUNTER — Other Ambulatory Visit: Payer: Self-pay

## 2024-08-03 DIAGNOSIS — O24410 Gestational diabetes mellitus in pregnancy, diet controlled: Secondary | ICD-10-CM

## 2024-08-03 DIAGNOSIS — Z3A15 15 weeks gestation of pregnancy: Secondary | ICD-10-CM

## 2024-08-03 DIAGNOSIS — O24419 Gestational diabetes mellitus in pregnancy, unspecified control: Secondary | ICD-10-CM

## 2024-08-03 DIAGNOSIS — O0990 Supervision of high risk pregnancy, unspecified, unspecified trimester: Secondary | ICD-10-CM

## 2024-08-03 DIAGNOSIS — O09299 Supervision of pregnancy with other poor reproductive or obstetric history, unspecified trimester: Secondary | ICD-10-CM

## 2024-08-03 DIAGNOSIS — R7303 Prediabetes: Secondary | ICD-10-CM

## 2024-08-03 DIAGNOSIS — O09529 Supervision of elderly multigravida, unspecified trimester: Secondary | ICD-10-CM

## 2024-08-03 DIAGNOSIS — O3660X0 Maternal care for excessive fetal growth, unspecified trimester, not applicable or unspecified: Secondary | ICD-10-CM

## 2024-08-16 DIAGNOSIS — O99210 Obesity complicating pregnancy, unspecified trimester: Secondary | ICD-10-CM | POA: Insufficient documentation

## 2024-08-24 ENCOUNTER — Ambulatory Visit: Payer: Self-pay | Admitting: Obstetrics and Gynecology

## 2024-08-24 ENCOUNTER — Other Ambulatory Visit (HOSPITAL_COMMUNITY)
Admission: RE | Admit: 2024-08-24 | Discharge: 2024-08-24 | Disposition: A | Payer: Self-pay | Source: Ambulatory Visit | Attending: Obstetrics and Gynecology | Admitting: Obstetrics and Gynecology

## 2024-08-24 ENCOUNTER — Other Ambulatory Visit: Payer: Self-pay

## 2024-08-24 ENCOUNTER — Encounter: Payer: Self-pay | Admitting: Obstetrics and Gynecology

## 2024-08-24 VITALS — BP 127/75 | HR 71 | Wt 193.0 lb

## 2024-08-24 DIAGNOSIS — O99212 Obesity complicating pregnancy, second trimester: Secondary | ICD-10-CM

## 2024-08-24 DIAGNOSIS — O0990 Supervision of high risk pregnancy, unspecified, unspecified trimester: Secondary | ICD-10-CM

## 2024-08-24 DIAGNOSIS — N898 Other specified noninflammatory disorders of vagina: Secondary | ICD-10-CM

## 2024-08-24 DIAGNOSIS — Z3A18 18 weeks gestation of pregnancy: Secondary | ICD-10-CM

## 2024-08-24 DIAGNOSIS — O09292 Supervision of pregnancy with other poor reproductive or obstetric history, second trimester: Secondary | ICD-10-CM

## 2024-08-24 DIAGNOSIS — O09299 Supervision of pregnancy with other poor reproductive or obstetric history, unspecified trimester: Secondary | ICD-10-CM

## 2024-08-24 DIAGNOSIS — O24419 Gestational diabetes mellitus in pregnancy, unspecified control: Secondary | ICD-10-CM

## 2024-08-24 DIAGNOSIS — O3660X0 Maternal care for excessive fetal growth, unspecified trimester, not applicable or unspecified: Secondary | ICD-10-CM

## 2024-08-24 DIAGNOSIS — O3662X0 Maternal care for excessive fetal growth, second trimester, not applicable or unspecified: Secondary | ICD-10-CM

## 2024-08-24 DIAGNOSIS — O99210 Obesity complicating pregnancy, unspecified trimester: Secondary | ICD-10-CM

## 2024-08-24 DIAGNOSIS — O0992 Supervision of high risk pregnancy, unspecified, second trimester: Secondary | ICD-10-CM

## 2024-08-24 NOTE — Progress Notes (Signed)
 "  PRENATAL VISIT NOTE  Subjective:  Robin Alvarado is a 44 y.o. G5P4004 at [redacted]w[redacted]d being seen today for ongoing prenatal care.  She is currently monitored for the following issues for this high-risk pregnancy and has Pre-diabetes; Breast calcifications on mammogram; Hyperlipidemia; Supervision of high risk pregnancy, antepartum; History of LGA (large for gestational age); H/O shoulder dystocia in prior pregnancy, currently pregnant; GDM (gestational diabetes mellitus); AMA (advanced maternal age) multigravida 35+; and Obesity affecting pregnancy, antepartum on their problem list.  Patient reports vaginal irritation.  Contractions: Not present. Vag. Bleeding: None.  Movement: Present. Denies leaking of fluid.   The following portions of the patient's history were reviewed and updated as appropriate: allergies, current medications, past family history, past medical history, past social history, past surgical history and problem list.   Objective:   Vitals:   08/24/24 1129  BP: 127/75  Pulse: 71  Weight: 193 lb (87.5 kg)    Fetal Status:  Fetal Heart Rate (bpm): 154   Movement: Present    General: Alert, oriented and cooperative. Patient is in no acute distress.  Skin: Skin is warm and dry. No rash noted.   Cardiovascular: Normal heart rate noted  Respiratory: Normal respiratory effort, no problems with respiration noted  Abdomen: Soft, gravid, appropriate for gestational age.  Pain/Pressure: Absent     Pelvic: Cervical exam deferred      Examined vulva and redness noted.   Extremities: Normal range of motion.  Edema: None  Mental Status: Normal mood and affect. Normal behavior. Normal judgment and thought content.      Assessment and Plan:  Pregnancy: G5P4004 at [redacted]w[redacted]d 1. Supervision of high risk pregnancy, antepartum (Primary) Routine AFP done.  Anatomy US  scheduled for 2/4.  Interpreter used throughout. (Eda) - AFP, Serum, Open Spina Bifida - Cervicovaginal ancillary  only  2. [redacted] weeks gestation of pregnancy  3. Vaginal itching Await cultures for treatment. May continue vaseline as needed for comfort.  - Cervicovaginal ancillary only  4. Gestational diabetes mellitus (GDM), antepartum, gestational diabetes method of control unspecified Current regimen: Diet CBG review: Log shows almost all completely normal.  Regimen changes: Continue diet Growth: Will have serial growth.  Antenatal monitoring: Begin antenatal testing at 32 weeks Other needs: None at this time. Consider fetal echo. Await MFM input from anatomy US  first.   5. H/O shoulder dystocia in prior pregnancy, currently pregnant We discussed her history of shoulder dystocia. Reviewed the risk of recurrence is quoted between 10-50% and wide range because it is difficult to predict. We discussed the risks associated with shoulder dystocia - namely that of brachial plexus injury that can be permanent resulting in hand injury, and anoxic brain injury in severe cases. Fortunately these outcomes are rare. Nevertheless, we discussed that LGA in the setting in DM is at HIGHER risk because the weight distribution is different.  We discussed that if as her pregnancy goes on she has an EFW predicted to be >4500g, then we would recommend cesarean delivery because of that different weight distribution.   Would recommend delivery no later than 39w, but possibly 37w depending on GDM control.  We reviewed this all in the context of her GDM and that what is in her control to reduce this risk is following the DM diet and continuing to check her CBGs as she has been.  If we recommended c-section, she would agree to this.   6. History of LGA (large for gestational age) Has h/o LGA, tested to  4570g, but did not have GDM with these pregnancies.  US  has not been accurate for her.   7. Obesity affecting pregnancy, antepartum, unspecified obesity type   Preterm labor symptoms and general obstetric precautions including  but not limited to vaginal bleeding, contractions, leaking of fluid and fetal movement were reviewed in detail with the patient. Please refer to After Visit Summary for other counseling recommendations.   Return in about 4 weeks (around 09/21/2024) for OB VISIT, MD or APP.  Future Appointments  Date Time Provider Department Center  08/30/2024 10:00 AM [facility] PROVIDER 1 [facility]  08/30/2024 10:30 AM [facility] US3 [facility]  09/14/2024  9:15 AM [facility]    Vina Solian, MD "

## 2024-08-25 ENCOUNTER — Ambulatory Visit: Payer: Self-pay | Admitting: Obstetrics and Gynecology

## 2024-08-25 DIAGNOSIS — O0990 Supervision of high risk pregnancy, unspecified, unspecified trimester: Secondary | ICD-10-CM

## 2024-08-25 LAB — AFP, SERUM, OPEN SPINA BIFIDA
AFP MoM: 0.98
AFP Value: 36.9 ng/mL
Gest. Age on Collection Date: 18.2 wk
Maternal Age At EDD: 44.2 a
OSBR Risk 1 IN: 10000
Test Results:: NEGATIVE
Weight: 193 [lb_av]

## 2024-08-25 LAB — CERVICOVAGINAL ANCILLARY ONLY
Bacterial Vaginitis (gardnerella): NEGATIVE
Candida Glabrata: NEGATIVE
Candida Vaginitis: POSITIVE — AB
Comment: NEGATIVE
Comment: NEGATIVE
Comment: NEGATIVE
Comment: NEGATIVE
Trichomonas: NEGATIVE

## 2024-08-29 NOTE — Telephone Encounter (Signed)
 Called patient to follow up on need for yeast treatment. Patient has not started Monistat. Reviewed instructions for use. Patient to purchase over the counter. Spanish interpreter Zelda assisted with phone call.

## 2024-08-30 ENCOUNTER — Ambulatory Visit: Payer: Self-pay | Attending: Obstetrics and Gynecology | Admitting: Obstetrics

## 2024-08-30 ENCOUNTER — Ambulatory Visit: Payer: Self-pay

## 2024-08-30 VITALS — BP 135/67 | HR 78 | Ht 63.5 in

## 2024-08-30 DIAGNOSIS — O99212 Obesity complicating pregnancy, second trimester: Secondary | ICD-10-CM

## 2024-08-30 DIAGNOSIS — O0990 Supervision of high risk pregnancy, unspecified, unspecified trimester: Secondary | ICD-10-CM

## 2024-08-30 DIAGNOSIS — Z3A19 19 weeks gestation of pregnancy: Secondary | ICD-10-CM

## 2024-08-30 DIAGNOSIS — O09522 Supervision of elderly multigravida, second trimester: Secondary | ICD-10-CM | POA: Insufficient documentation

## 2024-08-30 DIAGNOSIS — O09292 Supervision of pregnancy with other poor reproductive or obstetric history, second trimester: Secondary | ICD-10-CM | POA: Insufficient documentation

## 2024-08-30 DIAGNOSIS — Z363 Encounter for antenatal screening for malformations: Secondary | ICD-10-CM | POA: Insufficient documentation

## 2024-08-30 DIAGNOSIS — O99210 Obesity complicating pregnancy, unspecified trimester: Secondary | ICD-10-CM

## 2024-08-30 DIAGNOSIS — O09299 Supervision of pregnancy with other poor reproductive or obstetric history, unspecified trimester: Secondary | ICD-10-CM

## 2024-08-30 DIAGNOSIS — O24410 Gestational diabetes mellitus in pregnancy, diet controlled: Secondary | ICD-10-CM | POA: Insufficient documentation

## 2024-08-30 DIAGNOSIS — O0992 Supervision of high risk pregnancy, unspecified, second trimester: Secondary | ICD-10-CM | POA: Insufficient documentation

## 2024-08-30 DIAGNOSIS — O3660X0 Maternal care for excessive fetal growth, unspecified trimester, not applicable or unspecified: Secondary | ICD-10-CM

## 2024-08-30 DIAGNOSIS — O24419 Gestational diabetes mellitus in pregnancy, unspecified control: Secondary | ICD-10-CM

## 2024-08-30 NOTE — Progress Notes (Signed)
 MFM Consult Note  Robin Alvarado is currently at [redacted]w[redacted]d. She was seen today for a detailed fetal anatomy scan due to advanced maternal age (44 years old) and recently diagnosed diet-controlled gestational diabetes.  She reports that the majority of her fingerstick values have been within normal limits.  She has a history of shoulder dystocia during the delivery of two prior children who weighed between 9 to 10 pounds.  She had a cell free DNA test earlier in her pregnancy which indicated a low risk for trisomy 72, 62, and 13. A female fetus is predicted.   Sonographic findings Single intrauterine pregnancy at 19w 1d. Fetal cardiac activity:  Observed and appears normal. Presentation: Variable. The views of the fetal anatomy were limited today due to the fetal position.  What was visualized today appeared within normal limits. Fetal biometry shows the estimated fetal weight of 0 lb 10 oz, 295 grams (66%). Amniotic fluid: Within normal limits.  MVP: 6.91 cm. Placenta: Left lateral. Adnexa: No abnormality visualized. Cervical length: 3.6 cm.  The patient was informed that anomalies may be missed due to technical limitations. If the fetus is in a suboptimal position or maternal habitus is increased, visualization of the fetus in the maternal uterus may be impaired.  Gestational Diabetes and Pregnancy  She was advised to continue to monitor her fingersticks 4 times daily (fasting and 2 hours after each meal).    She was advised that our goals for her fingerstick values are fasting values of 90-95 or less and two-hour postprandial values of 120 or less.    Should the majority (greater than 50%) of her fingerstick results be above these values, she may have to be started on insulin or metformin  to help her achieve better glycemic control.   Due to gestational diabetes and advanced maternal age, we will continue to follow her with monthly growth ultrasounds.    Weekly fetal testing  should be started at 32 weeks and continued weekly until delivery.  Delivery will be recommended at between 37 to 39 weeks depending on the fetal weight and the level of her glycemic control later in her pregnancy.  Advanced maternal age  The increased risk of fetal aneuploidy due to advanced maternal age was discussed.   Due to advanced maternal age, the patient was offered and declined an amniocentesis today for definitive diagnosis of fetal aneuploidy. She is comfortable with the low risk indicated by her cell free DNA test.  She was advised to continue taking a daily baby aspirin for preeclampsia prophylaxis.  A follow-up growth scan was scheduled in 4 weeks.    All conversations were held with the patient today with the help of a Spanish interpreter.  The patient stated that all of her questions were answered.   A total of 45 minutes was spent counseling and coordinating the care for this patient.  Greater than 50% of the time was spent in direct face-to-face contact.

## 2024-09-14 ENCOUNTER — Other Ambulatory Visit: Payer: Self-pay

## 2024-09-19 ENCOUNTER — Encounter: Payer: Self-pay | Admitting: Obstetrics and Gynecology

## 2024-09-27 ENCOUNTER — Ambulatory Visit: Payer: Self-pay
